# Patient Record
Sex: Male | Born: 1980 | Race: White | Hispanic: No | Marital: Married | Smoking: Never smoker
Health system: Southern US, Community
[De-identification: ages and names within clinical notes are randomized; demographics above are authoritative.]

## PROBLEM LIST (undated history)

## (undated) DIAGNOSIS — G4700 Insomnia, unspecified: Secondary | ICD-10-CM

## (undated) HISTORY — PX: ELBOW SURGERY: SHX618

## (undated) HISTORY — PX: VASECTOMY: SHX75

## (undated) HISTORY — PX: LASIK: SHX215

## (undated) HISTORY — DX: Insomnia, unspecified: G47.00

---

## 2010-02-28 ENCOUNTER — Emergency Department: Payer: Self-pay | Admitting: Emergency Medicine

## 2010-06-15 ENCOUNTER — Emergency Department: Payer: Self-pay | Admitting: Emergency Medicine

## 2013-04-24 ENCOUNTER — Emergency Department: Payer: Self-pay | Admitting: General Practice

## 2013-05-07 ENCOUNTER — Ambulatory Visit: Payer: Self-pay | Admitting: General Practice

## 2014-03-01 ENCOUNTER — Emergency Department: Payer: Self-pay | Admitting: Emergency Medicine

## 2014-03-01 LAB — CBC
HCT: 42.2 % (ref 40.0–52.0)
HGB: 14.3 g/dL (ref 13.0–18.0)
MCH: 28.8 pg (ref 26.0–34.0)
MCHC: 34 g/dL (ref 32.0–36.0)
MCV: 85 fL (ref 80–100)
PLATELETS: 245 10*3/uL (ref 150–440)
RBC: 4.98 10*6/uL (ref 4.40–5.90)
RDW: 12.8 % (ref 11.5–14.5)
WBC: 7 10*3/uL (ref 3.8–10.6)

## 2014-03-02 LAB — COMPREHENSIVE METABOLIC PANEL
ALBUMIN: 3.6 g/dL (ref 3.4–5.0)
ANION GAP: 12 (ref 7–16)
Alkaline Phosphatase: 74 U/L
BUN: 16 mg/dL (ref 7–18)
Bilirubin,Total: 0.3 mg/dL (ref 0.2–1.0)
CALCIUM: 8.3 mg/dL — AB (ref 8.5–10.1)
CHLORIDE: 108 mmol/L — AB (ref 98–107)
CO2: 23 mmol/L (ref 21–32)
Creatinine: 1.87 mg/dL — ABNORMAL HIGH (ref 0.60–1.30)
EGFR (Non-African Amer.): 44 — ABNORMAL LOW
GFR CALC AF AMER: 54 — AB
GLUCOSE: 89 mg/dL (ref 65–99)
Osmolality: 286 (ref 275–301)
POTASSIUM: 3.8 mmol/L (ref 3.5–5.1)
SGOT(AST): 20 U/L (ref 15–37)
SGPT (ALT): 17 U/L
Sodium: 143 mmol/L (ref 136–145)
TOTAL PROTEIN: 6.6 g/dL (ref 6.4–8.2)

## 2014-03-02 LAB — TROPONIN I
Troponin-I: <0.02 ng/mL
Troponin-I: <0.02 ng/mL

## 2014-03-02 LAB — CK: CK, TOTAL: 138 U/L

## 2014-08-20 ENCOUNTER — Ambulatory Visit
Admit: 2014-08-20 | Disposition: A | Discharge status: Home / Self Care | Payer: Self-pay | Attending: General Practice | Admitting: General Practice

## 2014-10-20 ENCOUNTER — Emergency Department
Admission: EM | Admit: 2014-10-20 | Discharge: 2014-10-20 | Disposition: A | Discharge status: Home / Self Care | Payer: Worker's Compensation | Attending: Emergency Medicine | Admitting: Emergency Medicine

## 2014-10-20 ENCOUNTER — Encounter: Payer: Self-pay | Admitting: Emergency Medicine

## 2014-10-20 DIAGNOSIS — Z7721 Contact with and (suspected) exposure to potentially hazardous body fluids: Secondary | ICD-10-CM

## 2014-10-20 NOTE — Discharge Instructions (Signed)
Body Fluid Exposure Information °People may come into contact with blood and other body fluids under various circumstances. In some cases, body fluids may contain germs (bacteria or viruses) that cause infections. These germs can be spread when another person's body fluids come into contact with your skin, mouth, eyes, or genitals.  °Exposure to body fluids that may contain infectious material is a common problem for people providing care for others who are ill. It can occur when a person is performing health care tasks in the workplace or when taking care of a family member at home. Other common methods of exposure include injection drug use, sharing needles, and sexual activity. °The risk of an infection spreading through body fluid exposure is small and depends on a variety of factors. This includes the type of body fluid, the nature of the exposure, and the health status of the person who was the source of the body fluids. Your health care provider can help you assess the risk. °WHAT TYPES OF BODY FLUID CAN SPREAD INFECTION? °The following types of body fluid have the potential to spread infections: °· Blood. °· Semen. °· Vaginal secretions. °· Urine. °· Feces. °· Saliva. °· Nasal or eye discharge. °· Breast milk. °· Amniotic fluid and fluids surrounding body organs. °WHAT ARE SOME FIRST-AID MEASURES FOR BODY FLUID EXPOSURE? °The following steps should be taken as soon as possible after a person is exposed to body fluids: °Intact Skin °· For contact with closed skin, wash the area with soap and water. °Broken Skin °· For contact with broken skin (a wound), wash the area with soap and water. Let the area bleed a little. Then place a bandage or clean towel on the wound, applying gentle pressure to stop the bleeding. Do not squeeze or rub the area. °· Use just water or hand sanitizer if a sink with soap is not available. °· Do not use harsh chemicals such as bleach or iodine. °Eyes °· Rinse the eyes with water or  saline for 30 seconds. °· If the person is wearing contact lenses, leave the contact lenses in while rinsing the eyes. Once the rinsing is complete, remove the contact lenses. °Mouth °· Spit out the fluids. Rinse and spit with water 4-5 times. °In addition, you should remove any clothing that comes into contact with body fluids. However, if body fluid exposure results from sexual assault, seek medical care immediately without changing clothes or bathing. °WHEN SHOULD YOU SEEK HELP? °After performing the proper first-aid steps, you should contact your health care provider or seek emergency care right away if blood or other body fluids made contact with areas of broken skin or openings such as the eyes or mouth. If the exposure to body fluid happened in the workplace, you should report it to your work supervisor immediately. Many workplaces have procedures in place for exposure situations. °WHAT WILL HAPPEN AFTER YOU REPORT THE EXPOSURE? °Your health care provider will ask you several questions. Information requested may include: °· Your medical history, including vaccination records. °· Date and time of the exposure. °· Whether you saw body fluids during the exposure. °· Type of body fluid you were exposed to. °· Volume of body fluid you were exposed to. °· How the exposure happened. °· If any devices, such as a needle, were being used. °· Which area of your body made contact with the body fluid. °· Description of any injury to the skin or other area. °· How long contact was made with the body   fluid. °· Any information you have about the health status of the person whose body fluid you were exposed to. °The health care provider will assess your risk of infection. Often, no treatment is necessary. In some cases, the health care provider may recommend doing blood tests right away. Follow-up blood tests may also be done at certain intervals during the upcoming weeks and months to check for changes. You may be offered  treatment to prevent an infection from developing after exposure (post-exposure prophylaxis). This may include certain vaccinations or medicines and may be necessary when there is a risk of a serious infection, such as HIV or hepatitis B. Your health care provider should discuss appropriate treatment and vaccinations with you. °HOW CAN YOU PREVENT EXPOSURE AND INFECTION? °Always remember that prevention is the first line of defense against body fluid exposure. To help prevent exposure to body fluids: °· Wash and disinfect countertops and other surfaces regularly. °· Wear appropriate protective gear such as gloves, gowns, or eyewear when the possibility of exposure is present. °· Wipe away spills of body fluid with disposable towels. °· Properly dispose of blood products and other fluids. Use secured bags. °· Properly dispose of needles and other instruments with sharp points or edges (sharps). Use closed, marked containers. °· Avoid injection drug use. °· Do not share needles. °· Avoid recapping needles. °· Use a condom during sexual intercourse. °· Make sure you learn and follow any guidelines for preventing exposure (universal precautions) provided at your workplace. °To help reduce your chances of getting an infection: °· Make sure your vaccinations are up-to-date, including those for tetanus and hepatitis. °· Wash your hands frequently with soap and water. Use hand sanitizers. °· Avoid having multiple sex partners. °· Follow up with your health care provider as directed after being evaluated for an exposure to body fluids. °To avoid spreading infection to others: °· Do not have sexual relations until you know you are free of infection. °· Do not donate blood, plasma, breast milk, sperm, or other body fluids. °· Do not share hygiene tools such as toothbrushes, razors, or dental floss. °· Keep open wounds covered. °· Dispose of any items with blood on them (razors, tampons, bandages) by putting them in the  trash. °· Do not share drug supplies with others, such as needles, syringes, straws, or pipes. °· Follow all of your health care provider's instructions for preventing the spread of infection. °Document Released: 12/19/2012 Document Revised: 04/23/2013 Document Reviewed: 12/19/2012 °Exit[employer]® Patient Information ©2015 [employer]. This information is not intended to replace advice given to you by your health care provider. Make sure you discuss any questions you have with your health care provider. ° °

## 2014-10-20 NOTE — ED Notes (Signed)
Broght to Ed via sqad car for eval. Of blood exposure in the field. Received A&O*3 speaking full sentences. Denies pain or SOB.
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
## 2015-05-01 ENCOUNTER — Ambulatory Visit
Admission: RE | Admit: 2015-05-01 | Discharge: 2015-05-01 | Disposition: A | Discharge status: Home / Self Care | Payer: BLUE CROSS/BLUE SHIELD | Source: Ambulatory Visit | Attending: Family Medicine | Admitting: Family Medicine

## 2015-05-01 ENCOUNTER — Other Ambulatory Visit: Payer: BLUE CROSS/BLUE SHIELD

## 2015-05-01 ENCOUNTER — Other Ambulatory Visit: Payer: Self-pay | Admitting: Family Medicine

## 2015-05-01 DIAGNOSIS — R0602 Shortness of breath: Secondary | ICD-10-CM

## 2016-11-24 DIAGNOSIS — S46219A Strain of muscle, fascia and tendon of other parts of biceps, unspecified arm, initial encounter: Secondary | ICD-10-CM | POA: Insufficient documentation

## 2018-02-16 ENCOUNTER — Other Ambulatory Visit: Payer: Self-pay

## 2018-02-16 ENCOUNTER — Emergency Department: Payer: No Typology Code available for payment source

## 2018-02-16 ENCOUNTER — Encounter: Payer: Self-pay | Admitting: Emergency Medicine

## 2018-02-16 ENCOUNTER — Emergency Department
Admission: EM | Admit: 2018-02-16 | Discharge: 2018-02-16 | Disposition: A | Discharge status: Home / Self Care | Payer: No Typology Code available for payment source | Attending: Emergency Medicine | Admitting: Emergency Medicine

## 2018-02-16 DIAGNOSIS — W51XXXA Accidental striking against or bumped into by another person, initial encounter: Secondary | ICD-10-CM | POA: Insufficient documentation

## 2018-02-16 DIAGNOSIS — Y9389 Activity, other specified: Secondary | ICD-10-CM | POA: Diagnosis not present

## 2018-02-16 DIAGNOSIS — Y929 Unspecified place or not applicable: Secondary | ICD-10-CM | POA: Insufficient documentation

## 2018-02-16 DIAGNOSIS — Y990 Civilian activity done for income or pay: Secondary | ICD-10-CM | POA: Diagnosis not present

## 2018-02-16 DIAGNOSIS — S0990XA Unspecified injury of head, initial encounter: Secondary | ICD-10-CM | POA: Diagnosis present

## 2018-02-16 DIAGNOSIS — S0001XA Abrasion of scalp, initial encounter: Secondary | ICD-10-CM | POA: Insufficient documentation
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
## 2018-02-16 NOTE — ED Notes (Signed)
Spoke with [employer] representative, no drug screen needed.
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
## 2018-02-16 NOTE — ED Notes (Signed)
Hard copy of DC signed, signature pad non-functional.

## 2018-02-18 ENCOUNTER — Emergency Department
Admission: EM | Admit: 2018-02-18 | Discharge: 2018-02-18 | Disposition: A | Discharge status: Home / Self Care | Payer: No Typology Code available for payment source | Attending: Emergency Medicine | Admitting: Emergency Medicine

## 2018-02-18 ENCOUNTER — Other Ambulatory Visit: Payer: Self-pay

## 2018-02-18 ENCOUNTER — Emergency Department: Payer: No Typology Code available for payment source

## 2018-02-18 DIAGNOSIS — F0781 Postconcussional syndrome: Secondary | ICD-10-CM | POA: Insufficient documentation

## 2018-02-18 DIAGNOSIS — R51 Headache: Secondary | ICD-10-CM | POA: Diagnosis not present

## 2018-02-18 NOTE — Discharge Instructions (Addendum)
Your exam is essentially normal at this time. There is no evidence of a serious, closed head injury. You are experiencing symptoms consistent with a post-concussive syndrome. These symptoms can last for days to weeks. Follow-up with the [facility] at the [facility], for further management. I suggest eye rest; by avoiding bright (fluorescent) lights, fine print (books/magazines), and cell phone use (texting). Take OTC Tylenol or Ibuprofen as needed for headache pain relief.
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
## 2018-02-18 NOTE — ED Notes (Signed)
Pt pt had CT head 2 days ago when seen. Per Dr. Shaune Pollack no need to re-do CT head and no blood work at this time. OK for Flex.

## 2018-10-23 ENCOUNTER — Other Ambulatory Visit: Payer: Self-pay | Admitting: Internal Medicine

## 2019-01-08 ENCOUNTER — Ambulatory Visit: Payer: Self-pay | Admitting: Internal Medicine

## 2019-01-08 ENCOUNTER — Encounter: Payer: Self-pay | Admitting: Internal Medicine

## 2019-01-08 ENCOUNTER — Other Ambulatory Visit: Payer: Self-pay

## 2019-01-08 DIAGNOSIS — M25522 Pain in left elbow: Secondary | ICD-10-CM | POA: Insufficient documentation

## 2019-01-08 DIAGNOSIS — S46292A Other injury of muscle, fascia and tendon of other parts of biceps, left arm, initial encounter: Secondary | ICD-10-CM | POA: Diagnosis not present

## 2019-01-08 DIAGNOSIS — S56812A Strain of other muscles, fascia and tendons at forearm level, left arm, initial encounter: Secondary | ICD-10-CM | POA: Diagnosis not present

## 2019-01-08 MED ORDER — NAPROXEN 500 MG PO TABS: 500.0000 mg | ORAL_TABLET | Freq: Two times a day (BID) | ORAL | 2 refills | Status: DC | PRN

## 2019-01-08 NOTE — Progress Notes (Signed)
S - Nursing note reviewed.   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  When lifting the state, he felt a pop, and had pain immediately in the medial left elbow region down towards his wrist.  The pain was so sharp he instantly vomited.  He noted the forearm beneath the elbow on the medial side bruised rather rapidly over the next hours.  He also noted it remained quite painful, and he has been limited in his activities.  He did ice rather liberally for the first 48 hours, and has been taking 800 mg of ibuprofen every 6 hours.  His pain is pretty much all the time, although intermittently it becomes very sharp and a 9 out of 10 type pain.  He has returned to work as a Engineer, structural on patrol, both yesterday and so far today.  He does admit if he had to wrestle a suspect or do any kind of lifting activities with that left upper extremity, he would be markedly limited.  He has never injured the medial side of his elbow in the past although he did have surgery on a tendon on the lateral side which he tore.  He states he was told it was shredded by the surgeon when it was fixed.  It was done by a Psychologist, sport and exercise in [place].  He states he does not really have to see that surgeon again for this injury, and would not like to drive up to [facility] with return appointments.  No Known Allergies Current Outpatient Medications on File Prior to Visit  Medication Sig Dispense Refill  . traZODone (DESYREL) 100 MG tablet TAKE 1 TABLET BY MOUTH AT BEDTIME AS NEEDED INSOMNIA 45 tablet 2   No current facility-administered medications on file prior to visit.    He has no smoking history.  O - NAD, masked  BP 115/68 (BP Location: Right Arm, Patient Position: Sitting, Cuff Size: Large)   Pulse 74   Temp 98.6 F (37 C) (Oral)   Resp 12   Ht 5\' 8"  (1.727 m)   Wt 195 lb (88.5 kg)   SpO2 98%   BMI 29.65 kg/m    UE - left Elbow -  Limited ROM at extremes mostly, very hesitant with extremes of flexion and extension  No marked swelling, mild likely medial and residual/fading bruising on proximal forearm medially  Not tender at lateral epicondyle  Tender at medial epicondyle diffusely surrounding   NT olecranon  Limited strength with biceps testing, was painful with more forceful exertion testng the biceps, no pain with triceps testing  Increased pain with wrist extension vs resistance, also with flexion vs resistance  Increased pain with pronation vs resistance, also with supination vs res  No mark increased pain with hand grip  Sensation intact to LT in distal UE and pulses good distal  Affect was not flat, approp with conversation  Ass/Plan: 1.  Left elbow pain, after a traumatic injury as described above.  Concern for a tear of a ligamentous type structure, potentially the ulnar collateral ligament.  Concern for some some bicep involvement as well.  Will refer to orthopedics, as do feel a further imaging study is needed, preferably an MRI or ultrasound type study as an x-ray will tell me little about the soft tissue structures.  May need an x-ray for completeness sake, although will await the evaluation from orthopedics presently. Naproxen sodium-500mg  twice daily with  food in the short-term as needed, as he states he does well with naproxen, not to take ibuprofen while taking. 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 He would prefer to see orthopedics here in the area, with the [facility] that was noted, and he will likely be seen by them here in the next 24 hours to 48 hours to be assessed.  Await their input.

## 2019-01-08 NOTE — Patient Instructions (Signed)
Take referral to [facility] they will see you as a walk-in until 7:30pm

## 2019-01-08 NOTE — Progress Notes (Signed)
Left elbow pain - taking a back seat out of a car & when lifting the seat, felt a pop in the elbow from wrist to the elbow.  Pain so sharp he instantly vomited. Then the wrist turned inward - pain so intense couldn't rest elbow on anything.  This happened Friday around noon.  Had surgery to the same elbow 3 years ago.  48 hours iced hourly.  800 mg ibuprofen q 6 hours.    Rates pain 9 on 0-10 when he moves arm certain way.  Sitting right now, it's uncomfortable.

## 2019-01-21 ENCOUNTER — Ambulatory Visit: Payer: Managed Care, Other (non HMO) | Admitting: Internal Medicine

## 2019-01-21 ENCOUNTER — Encounter: Payer: Self-pay | Admitting: Internal Medicine

## 2019-01-21 ENCOUNTER — Other Ambulatory Visit: Payer: Self-pay

## 2019-01-21 VITALS — BP 111/70 | HR 98 | Temp 97.7°F | Resp 12 | Ht 68.0 in | Wt 196.0 lb

## 2019-01-21 DIAGNOSIS — M25522 Pain in left elbow: Secondary | ICD-10-CM

## 2019-01-21 NOTE — Progress Notes (Signed)
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  I had referred him to orthopedics and he saw them later that same day at [facility]. I do not have access to get the note from that visit on Epic and do not have records from that. He noted ortho informed him that he likely had a tear, and an MRI was ordered but since he had not met his deductable, he had to provide over $600 up front to get done and he did not have that to give them.  To recall, when lifting the seat, he felt a pop, and had pain immediately in the medial left elbow region down towards his wrist.  The pain was so sharp he instantly vomited.  He noted the forearm beneath the elbow on the medial side bruised rather rapidly over the next hours.  He also noted it remained quite painful, and limited his activities. His pain was pretty much all the time, although intermittently it was very sharp and a 9 out of 10 type pain. He had returned to work as a Engineer, structural on patrol, prior to our visit with activities limited after our visit.   Over the almost two weeks since, he noted it feels much better, not painful at rest, was in the gym and did only very light lifting and tolerated. He noted when he tore the tendon on his lateral side of the elbow, he went years before having fixed and had cortisone shots at time to help and continued to work without a problem. He is anxious to return to work presently and is right handed and does not feel there will be any concerns with returning. He is taking the naproxen presently. Has not iced in last few days as not need to per patient.  He has never injured the medial side of his elbow in the past although he did have surgery on a tendon on the lateral side which he tore. It was  done by a Psychologist, sport and exercise in [place].  He is scheduled to return to work Friday. No Known Allergies Current Outpatient Medications on File Prior to Visit  Medication Sig Dispense Refill  . naproxen (NAPROSYN) 500 MG tablet Take 1 tablet (500 mg total) by mouth 2 (two) times daily as needed. Take with food 30 tablet 2  . traZODone (DESYREL) 100 MG tablet TAKE 1 TABLET BY MOUTH AT BEDTIME AS NEEDED INSOMNIA 45 tablet 2   No current facility-administered medications on file prior to visit.     He has no smoking history.  O - NAD, masked  BP 111/70 (BP Location: Right Arm, Patient Position: Sitting, Cuff Size: Large)   Pulse 98   Temp 97.7 F (36.5 C) (Oral)   Resp 12   Ht '5\' 8"'  (1.727 m)   Wt 196 lb (88.9 kg)   SpO2 98%   BMI 29.80 kg/m     UE - left Elbow - ROM of left elbow good without pain              No marked swelling, bruising medial side has resolved             Not tender at lateral epicondyle, + scar  Not tender palpating medial epicondyle, nor surrounding and NT down forearm distally             NT olecranon  No elbow laxity medially at 0 and 30 degrees             Very good strength with biceps testing, not painful             No increased pain with wrist extension vs resistance, nor with flexion vs resistance             No increased pain with pronation vs resistance, also with supination vs res             No mark increased pain with hand grip  All of the above testing vs resistance noted very good strength             Sensation intact to LT in distal UE and pulses good distal  Affect was not flat, approp with conversation  Ass/Plan: 1.  Left elbow pain, after a traumatic injury as described above.  Definitely better clinically, concern for a tear still (possibly the ulnar collateral ligament/flexor muscle/tendon and some concern for possibly some bicep involvement as well). Ideally, the next steps would include the MRI that the orthopedic MD  ordered to help assess if any surgery would be rec'ed. He was aware of this when discussed, but noted he just cannot afford at present. Ortho f/u felt needed only if has the MRI per patient   Did agree with ortho's rec and explained why that is best, and he understood, but the cost is prohibitive to him presently, and do feel his injury can often be well compensated for by adjacent structures and not be overly limiting, especially if partial tears and not complete and not involve the entire ligament or tendon (and he noted working for years with the tear on the lateral side of the elbow without being limited).    His ROM and strength testing today was very good and do feel he can do the functions of his job presently without limitations (especially noting he is right handed and this is his non-dominant hand) Would stop the Naproxen presently, can still ice prn and use ibuprofen with food prn if sore Note for work given with return to work ok after today and planned Friday at the earliest.  Emphasized to him if he has any limitations arise as he is returning to work, the importance to f/u immediately and he understood and agreed. Also to follow-up if more problematic over time and sx's again worsen.

## 2019-01-28 ENCOUNTER — Ambulatory Visit
Admission: RE | Admit: 2019-01-28 | Discharge: 2019-01-28 | Disposition: A | Discharge status: Home / Self Care | Payer: Self-pay | Source: Ambulatory Visit | Attending: Registered Nurse | Admitting: Registered Nurse

## 2019-01-28 ENCOUNTER — Encounter: Payer: Self-pay | Admitting: Registered Nurse

## 2019-01-28 ENCOUNTER — Ambulatory Visit: Payer: Managed Care, Other (non HMO) | Admitting: Registered Nurse

## 2019-01-28 ENCOUNTER — Other Ambulatory Visit: Payer: Self-pay

## 2019-01-28 VITALS — BP 131/75 | HR 100 | Temp 98.3°F | Resp 16 | Ht 68.0 in

## 2019-01-28 DIAGNOSIS — W19XXXA Unspecified fall, initial encounter: Secondary | ICD-10-CM

## 2019-01-28 DIAGNOSIS — M79602 Pain in left arm: Secondary | ICD-10-CM | POA: Insufficient documentation

## 2019-01-28 DIAGNOSIS — M79603 Pain in arm, unspecified: Secondary | ICD-10-CM | POA: Insufficient documentation

## 2019-01-28 MED ORDER — ACETAMINOPHEN 500 MG PO TABS: 1000.0000 mg | ORAL_TABLET | Freq: Four times a day (QID) | ORAL | 0 refills | Status: AC | PRN

## 2019-01-28 MED ORDER — KETOROLAC TROMETHAMINE 60 MG/2ML IM SOLN
60.0000 mg | Freq: Once | INTRAMUSCULAR | Status: AC
  Administered 2019-01-28: 60 mg via INTRAMUSCULAR

## 2019-01-28 NOTE — Progress Notes (Signed)
Subjective:    Patient ID: Gary Ortiz, male    DOB: 01/12/1981, 38 y.o.   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  Has pain with rotating wrist in his middle/proximal forearm, some swelling and redness noted.  Hasn't applied ice or taken any pain medication today.  Had injured his upper arm/biceps left and was seen by [facility] on 01/08/2019 for partial versus full thickness biceps tendon rupture.  MRI ordered but patient did not have completed due to copay required.  Patient reported his upper left arm has been feeling better and he has been back in the gym.  Previously injured area not hurting him at this time.  Toradol has worked well for him in the past.  He has not taken any tylenol/motrin/advil/aleve/naproxen/naprosyn/meloxicam/tramadol today.  Patient denied weakness in left arm just tender to touch or to perform twisting motion with hand/wrist results in forearm pain.  "I drove over here with just my right hand"  Showered and changed uniforms after injury due to subject (mental patient) defecated on me.  Right hand dominant     Review of Systems  Constitutional: Negative for activity change, appetite change, chills, diaphoresis, fatigue and fever.  HENT: Negative for trouble swallowing and voice change.   Eyes: Negative for photophobia and visual disturbance.  Respiratory: Negative for cough, choking, chest tightness, shortness of breath, wheezing and stridor.   Cardiovascular: Negative for chest pain, palpitations and leg swelling.  Gastrointestinal: Negative for abdominal pain, diarrhea, nausea and vomiting.  Endocrine: Negative for cold intolerance and heat intolerance.  Genitourinary: Negative for difficulty urinating.  Musculoskeletal: Positive for myalgias. Negative for arthralgias, back pain, gait problem, neck pain and  neck stiffness.  Skin: Positive for color change. Negative for pallor and wound.  Allergic/Immunologic: Negative for food allergies.  Neurological: Negative for dizziness, tremors, seizures, syncope, facial asymmetry, speech difficulty, weakness, light-headedness, numbness and headaches.  Hematological: Negative for adenopathy. Does not bruise/bleed easily.  Psychiatric/Behavioral: Negative for agitation, confusion and sleep disturbance.       Objective:   Physical Exam Vitals signs and nursing note reviewed.  Constitutional:      General: He is awake. He is not in acute distress.    Appearance: Normal appearance. He is well-developed, well-groomed and overweight. He is not ill-appearing, toxic-appearing or diaphoretic.  HENT:     Head: Normocephalic and atraumatic. No raccoon eyes, Battle's sign, abrasion, contusion, right periorbital erythema, left periorbital erythema or laceration.     Jaw: There is normal jaw occlusion.     Salivary Glands: Right salivary gland is not diffusely enlarged or tender. Left salivary gland is not diffusely enlarged or tender.     Right Ear: Hearing and external ear normal.     Left Ear: Hearing and external ear normal.     Nose: Nose normal.     Mouth/Throat:     Mouth: Mucous membranes are moist.     Pharynx: Oropharynx is clear.  Eyes:     General: Lids are normal. Vision grossly intact. Gaze aligned appropriately. No visual field deficit or scleral icterus.    Extraocular Movements: Extraocular movements intact.     Conjunctiva/sclera: Conjunctivae normal.     Pupils: Pupils are equal, round, and reactive to light.  Neck:     Musculoskeletal: Normal range of motion and neck supple. Normal range of  motion. No edema, erythema, neck rigidity, crepitus, injury, pain with movement, torticollis or muscular tenderness.     Trachea: Trachea normal.  Cardiovascular:     Rate and Rhythm: Normal rate and regular rhythm.     Pulses: Normal pulses.           Radial pulses are 2+ on the right side and 2+ on the left side.     Heart sounds: Normal heart sounds.  Pulmonary:     Effort: Pulmonary effort is normal. No respiratory distress.     Breath sounds: Normal breath sounds and air entry. No stridor or transmitted upper airway sounds. No wheezing, rhonchi or rales.     Comments: Wearing cloth mask due to covid 19 pandemic; no cough observed in exam room; spoke full sentences without difficulty Abdominal:     General: Abdomen is flat.     Palpations: Abdomen is soft.  Musculoskeletal: Normal range of motion.        General: Swelling, tenderness and signs of injury present. No deformity.     Right shoulder: Normal.     Left shoulder: Normal.     Right elbow: Normal.    Left elbow: Normal.     Right wrist: Normal.     Left wrist: Normal.     Right hip: Normal.     Left hip: Normal.     Right knee: Normal.     Left knee: Normal.     Right ankle: Normal.     Left ankle: Normal.     Cervical back: Normal.     Thoracic back: Normal.     Lumbar back: Normal.     Right upper arm: Normal.     Left upper arm: He exhibits no tenderness, no bony tenderness, no swelling, no edema, no deformity and no laceration.     Right forearm: Normal.     Left forearm: He exhibits tenderness, bony tenderness, swelling and edema. He exhibits no deformity and no laceration.       Arms:     Right hand: Normal. He exhibits normal range of motion, no tenderness, no bony tenderness, normal two-point discrimination, normal capillary refill, no deformity, no laceration and no swelling. Normal sensation noted. Normal strength noted. He exhibits no finger abduction, no thumb/finger opposition and no wrist extension trouble.     Left hand: Normal. He exhibits normal range of motion, no tenderness, no bony tenderness, normal two-point discrimination, normal capillary refill, no deformity, no laceration and no swelling. Normal sensation noted. Normal strength noted. He  exhibits no finger abduction, no thumb/finger opposition and no wrist extension trouble.     Right lower leg: No edema.     Left lower leg: No edema.     Comments: No crepitus/ecchymosis with AROM; patient grimacing with AROM especially supination and resisted arom left wrist/elbow; superficial and deep palpation even light touch elicits worsening pain over affected area anterior forearm.  Wrist flexion/extension/ulnar or radial deviation unresisted doesn't elicit or worsen pain against resistance it does in proximal forearm closest to elbow; distal radius/ulna and soft tissue not TTP; wrist superficial and deep palpation does not elicit pain.  Left upper arm palpation did not elicit pain/no ecchymosis/rash/erythema/increased temperature noted  Lymphadenopathy:     Head:     Right side of head: No submental, submandibular, tonsillar, preauricular, posterior auricular or occipital adenopathy.     Left side of head: No submental, submandibular, tonsillar, preauricular, posterior auricular or occipital adenopathy.     Cervical:  No cervical adenopathy.     Right cervical: No superficial cervical adenopathy.    Left cervical: No superficial cervical adenopathy.  Skin:    General: Skin is warm and dry.     Capillary Refill: Capillary refill takes less than 2 seconds.     Coloration: Skin is not ashen, cyanotic, jaundiced, mottled, pale or sallow.     Findings: Abrasion, erythema, signs of injury and rash present. No abscess, acne, bruising, burn, ecchymosis, laceration, lesion, petechiae or wound. Rash is macular and papular. Rash is not crusting, nodular, purpuric, pustular, scaling, urticarial or vesicular.     Nails: There is no clubbing.        Neurological:     General: No focal deficit present.     Mental Status: He is alert and oriented to person, place, and time. Mental status is at baseline.     GCS: GCS eye subscore is 4. GCS verbal subscore is 5. GCS motor subscore is 6.     Cranial  Nerves: Cranial nerves are intact. No cranial nerve deficit, dysarthria or facial asymmetry.     Sensory: Sensation is intact. No sensory deficit.     Motor: Motor function is intact. No weakness, tremor, atrophy, abnormal muscle tone or seizure activity.     Coordination: Coordination is intact. Coordination normal.     Gait: Gait is intact. Gait normal.     Deep Tendon Reflexes:     Reflex Scores:      Brachioradialis reflexes are 2+ on the right side and 2+ on the left side.    Comments: Finger opposition against resistance bilaterally equal 5/5; upper/lower extremities strength 5/5 equal but pain with left upper against resistance; gait sure and steady hallway; on/off exam table and in/out of chair without difficulty; patient will hold phone/papers/laptop with either hand but prefers right due to pain  Psychiatric:        Attention and Perception: Attention and perception normal.        Mood and Affect: Mood and affect normal.        Speech: Speech normal.        Behavior: Behavior normal. Behavior is cooperative.        Thought Content: Thought content normal.        Cognition and Memory: Cognition and memory normal.        Judgment: Judgment normal.     Ice applied to left forearm after physical exam completed with chemical ice pack from clinic stock prior to ketoralac 60mg  IM by .  1622 received verbal report from imaging center negative for fracture in forearm. See results note patient notified of negative fracture via telephone and verbalized understanding of information/continue plan of care as previously discussed for muscle strain/contusion.    Assessment & Plan:  A-left forearm pain acute, fall initial visit  P-work restriction avoid lifting greater than 5lbs left hand, no prolonged driving, no impact left hand follow up in 1 week re-eval.  Xray forearm today after leaving clinic at outpatient [facility] facility.  Apply ice QID 15 minutes prn swelling pain.   Toradol injection today 60mg  IM x 1 by RN [facility] and waiting 15 minutes in exam room after to ensure no hypotension/dizzyness.  Patient has received previously without side effects.  Discussed avoid other NSAIDS advil/aleve/naproxen/naprosyn/mobic/ibuprofen/meloxicam/tramadol x 24 hours post injection  Patient has naproxen at home max dosing with food 500mg  po BID.  If he prefers toradol may send in Rx for him for additional 4  days po use starting tomorrow afternoon. Depends on patient preference.   May use tylenol 1000mg  po QID prn pain.   Patient stated pain started to decrease prior to discharge from clinic, no bleeding from injection site denied dizzyness and he departed ambulatory in NAD, resp even and unlabored forearm not swelling more after ice and rest in exam room.  Patient to monitor and if decreased circulation to fingers e.g blue/cold/numb follow up for re-evaluation ER after clinic hours/ overnight.  [facility] during day.  If worsening swelling elevate limb and apply ice for 15 minutes.  If no improvement seek follow up eval.  Patient verbalized understanding information/instructions, agreed with plan of care and had no further questions at this time.

## 2019-01-28 NOTE — Progress Notes (Signed)
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  Discomfort is left inner forearm (below the elbow).  Can't turn it inside.  No OTC medications taken. No ice since incident occurred.  AMD

## 2019-01-28 NOTE — Patient Instructions (Addendum)
Wrist and Forearm Exercises Ask your health care provider which exercises are safe for you. Do exercises exactly as told by your health care provider and adjust them as directed. It is normal to feel mild stretching, pulling, tightness, or discomfort as you do these exercises. Stop right away if you feel sudden pain or your pain gets worse. Do not begin these exercises until told by your health care provider. Range-of-motion exercises These exercises warm up your muscles and joints and improve the movement and flexibility of your injured wrist and forearm. These exercises also help to relieve pain, numbness, and tingling. These exercises are done using the muscles in your injured wrist and forearm. Wrist flexion 1. Bend your left / right elbow to a 90-degree angle (right angle) with your palm facing the floor. 2. Bend your wrist so that your fingers point toward the floor (flexion). 3. Hold this position for ____15______ seconds. 4. Slowly return to the starting position. Repeat _____3_____ times. Complete this exercise ______3____ times a day. Wrist extension 1. Bend your left / right elbow to a 90-degree angle (right angle) with your palm facing the floor. 2. Bend your wrist so that your fingers point toward the ceiling (extension). 3. Hold this position for _____15_____ seconds. 4. Slowly return to the starting position. Repeat _____3_____ times. Complete this exercise ____3______ times a day. Ulnar deviation 1. Bend your left / right elbow to a 90-degree angle (right angle), and rest your forearm on a table with your palm facing down. 2. Keeping your hand flat on the table, bend your left /right wrist toward your small finger (pinkie). This is ulnar deviation. 3. Hold this position for ____15______ seconds. 4. Slowly return to the starting position. Repeat ____3______ times. Complete this exercise ____3______ times a day. Radial deviation 1. Bend your left / right elbow to a 90-degree  angle (right angle), and rest your forearm on a table with your palm facing down. 2. Keeping your hand flat on the table, bend your left /right wrist toward your thumb. This is radial deviation. 3. Hold this position for _____15_____ seconds. 4. Slowly return to the starting position. Repeat ___3_______ times. Complete this exercise ____3______ times a day. Forearm rotation, supination  1. Sit with your left / right elbow bent to a 90-degree angle (right angle). Position your forearm so that the thumb is facing the ceiling (neutral position). 2. Turn (rotate) your palm up toward the ceiling (supination), stopping when you feel a gentle stretch. 3. Hold this position for ___15_______ seconds. 4. Slowly return to the starting position. Repeat ____3______ times. Complete this exercise ____3______ times a day. Forearm rotation, pronation  1. Sit with your left / right elbow bent to a 90-degree angle (right angle). Position your forearm so that the thumb is facing the ceiling (neutral position). 2. Rotate your palm down toward the floor (pronation), stopping when you feel a gentle stretch. 3. Hold this position for _____15_____ seconds. 4. Slowly return to the starting position. Repeat ___3_______ times. Complete this exercise ____3______ times a day. Stretching These exercises warm up your muscles and joints and improve the movement and flexibility of your injured wrist and forearm. These exercises also help to relieve pain, numbness, and tingling. These exercises are done using your healthy wrist and forearm to help stretch the muscles in your injured wrist and forearm. Wrist flexion  1. Extend your left / right arm in front of you, and turn your palm down toward the floor. ? If told by  your health care provider, bend your left / right elbow to a 90-degree angle (right angle) at your side. 2. Using your uninjured hand, gently press over the back of your left / right hand to bend your wrist and  fingers toward the floor (flexion). Go as far as you can to feel a stretch without causing pain. 3. Hold this position for _____15_____ seconds. 4. Slowly return to the starting position. Repeat 3 times. Complete this exercise ___3_______ times a day. Wrist extension  1. Extend your left / right arm in front of you and turn your palm up toward the ceiling. ? If told by your health care provider, bend your left / right elbow to a 90-degree angle (right angle) at your side. 2. Using your uninjured hand, gently press over the palm of your left / right hand to bend your wrist and fingers toward the floor (extension). Go as far as you can to feel a stretch without causing pain. 3. Hold this position for ____15______ seconds. 4. Slowly return to the starting position. Repeat ____3______ times. Complete this exercise _____3_____ times a day. Forearm rotation, supination 1. Sit with your left / right elbow bent to a 90-degree angle (right angle). Position your forearm so that the thumb is facing the ceiling (neutral position). 2. Rotate your palm up toward the ceiling as far as you can on your own (supination). Then, use your uninjured hand to help turn your forearm more, stopping when you feel a gentle stretch. 3. Hold this position for ____15______ seconds. 4. Slowly return to the starting position. Repeat _____3_____ times. Complete this exercise ___3_______ times a day. Forearm rotation, pronation 1. Sit with your left / right elbow bent to a 90-degree angle (right angle). Position your forearm so that the thumb is facing the ceiling (neutral position). 2. Rotate your palm down toward the floor as far as you can on your own (pronation). Then, use your uninjured hand to help turn your forearm more, stopping when you feel a gentle stretch. 3. Hold this position for ___15_______ seconds. 4. Slowly return to the starting position. Repeat ___3_______ times. Complete this exercise ____3______ times a  day. Strengthening exercises These exercises build strength and endurance in your wrist and forearm. Endurance is the ability to use your muscles for a long time, even after they get tired. Wrist flexion  1. Sit with your left / right forearm supported on a table or other surface. Bend your elbow to a 90-degree angle (right angle), and rest your hand palm-up over the edge of the table. 2. Hold a ___none_______ weight in your left / right hand. Or, hold an exercise band or tube in both hands, keeping your hands at the same level and hip distance apart. There should be a slight tension in the exercise band or tube. 3. Slowly curl your hand up toward the ceiling (flexion). 4. Hold this position for __15________ seconds. 5. Slowly lower your hand back to the starting position. Repeat ____3______ times. Complete this exercise ____3______ times a day. Wrist extension  1. Sit with your left / right forearm supported on a table or other surface. Bend your elbow to a 90-degree angle (right angle), and rest your hand palm-down over the edge of the table. 2. Hold a __none________ weight in your left / right hand. Or, hold an exercise band or tube in both hands, keeping your hands at the same level and hip distance apart. There should be a slight tension in the exercise  band or tube. 3. Slowly curl your hand up toward the ceiling (extension). 4. Hold this position for ___15_______ seconds. 5. Slowly lower your hand back to the starting position. Repeat 3 times. Complete this exercise _____3_____ times a day. Forearm rotation, supination  1. Sit with your left / right forearm supported on a table or other surface. Bend your elbow to a 90-degree angle (right angle). Position your forearm so that your thumb is facing the ceiling (neutral position) and your hand is resting over the edge of the table. 2. Hold a hammer in your left / right hand. ? This exercise will be easier if you hold the hammer near the  head of the hammer. ? This exercise will be harder if you hold the hammer near the end of the handle. 3. Without moving your elbow, slowly rotate your palm up toward the ceiling (supination). 4. Hold this position for _____15_____ seconds. 5. Slowly return to the starting position. Repeat _____3_____ times. Complete this exercise ____3______ times a day. Forearm rotation, pronation  1. Sit with your left / right forearm supported on a table or other surface. Bend your elbow to a 90-degree angle (right angle). Position your forearm so that the thumb is facing the ceiling (neutral position), with your hand resting over the edge of the table. 2. Hold a hammer in your left / right hand. ? This exercise will be easier if you hold the hammer near the head of the hammer. ? This exercise will be harder if you hold the hammer near the end of the handle. 3. Without moving your elbow, slowly rotate your palm down toward the floor (pronation). 4. Hold this position for _____15_____ seconds. 5. Slowly return to the starting position. Repeat _____3_____ times. Complete this exercise ___3_______ times a day. Grip strengthening  1. Grasp a stress ball or other ball in the middle of your left / right hand. Start with your elbow bent to a 90-degree angle (right angle). 2. Slowly increase the pressure, squeezing the ball as hard as you can without causing pain. ? Think of bringing the tips of your fingers into the middle of your palm. All of your finger joints should bend when doing this exercise. ? To make this exercise harder, gradually try to straighten your elbow in front of you, until you can do the exercise with your elbow fully straight. 3. Hold your squeeze for __15________ seconds, then relax. If instructed by your health care provider, do this exercise: ? With your forearm positioned so that the thumb is facing the ceiling (neutral position). ? With your forearm turned palm down. ? With your forearm  turned palm up. Repeat _____3_____ times. Complete this exercise ______3____ times a day. This information is not intended to replace advice given to you by your health care provider. Make sure you discuss any questions you have with your health care provider. Document Released: 03/02/2005 Document Revised: 06/07/2018 Document Reviewed: 06/07/2018 Elsevier Patient Education  2020 [employer]  RICE Therapy for Routine Care of Injuries The routine care of many injuries includes rest, ice, compression, and elevation (RICE therapy). RICE therapy is often recommended for injuries to soft tissues, such as muscle strain, sprains, bruises, and overuse injuries. It can also be used for some bone injuries. Using RICE therapy can help to relieve pain and lessen swelling. Supplies needed:  Ice.  Plastic bag.  Towel.  Elastic bandage.  Pillow or pillows to raise (elevate) the injured body part. How to care for  your injury with RICE therapy  Rest Rest your injury. This may help with the healing process. Rest usually involves limiting your normal activities and not using the injured part of your body. Generally, you can return to your normal activities when your health care provider says it is okay and you can do them without much discomfort. If you rest the injury too much, it may not heal as well. Some injuries heal better with early movement instead of resting for too long. Talk with your health care provider about how you should limit your activities and whether you should start range-of-motion exercises for your injury. Ice Ice your injury to lessen swelling and pain. Do not apply ice directly to your skin.  Put ice in a plastic bag.  Place a towel between your skin and the bag.  Leave the ice on for 20 minutes, 2-3 times a day. Use ice on as many days as told by your health care provider. Compression Put pressure (compression) on your injured area to control swelling, give support, and  help with discomfort. Compression may be done with an elastic bandage. If an elastic bandage has been applied, follow these general tips:  Use the bandage as directed by the maker of the bandage that you are using.  Do notwrap the bandage too tightly. That may block (cut off) circulation in the arm or leg in the area below the bandage. ? If part of your body beyond the bandage becomes blue, numb, cold, swollen, or more painful, your bandage is probably too tight. If this occurs, remove your bandage and reapply it more loosely.  Remove and reapply the bandage every 3-4 hours or as told by your health care provider.  See your health care provider if the bandage seems to be making your problems worse rather than better. Elevation Elevate your injured area to lessen swelling and pain. If possible, elevate your injured area at or above the level of your heart or the center of your chest. Contact a health care provider if:  Your pain and swelling continue.  Your symptoms are getting worse rather than improving. Having these problems may mean that you need further evaluation or imaging tests, such as X-rays or an MRI. Sometimes, X-rays may not show a small broken bone (fracture) until days after the injury happened. Make a follow-up appointment with your health care provider. Ask your health care provider, or the department that is doing the imaging test, when your results will be ready. Get help right away if:  You have sudden severe pain at or below the area of your injury.  You have redness or increased swelling around your injury.  You have tingling or numbness at or below the area of your injury and it does not improve after you remove the elastic bandage. Summary  The routine care of many injuries includes rest, ice, compression, and elevation (RICE therapy). Using RICE therapy can help to relieve pain and lessen swelling.  RICE therapy is often recommended for injuries to soft tissues,  such as muscle strain, sprains, bruises, and overuse injuries. It can also be used for some bone injuries.  Seek medical care if your pain and swelling continue or if your symptoms are getting worse rather than improving. This information is not intended to replace advice given to you by your health care provider. Make sure you discuss any questions you have with your health care provider. Document Released: 07/31/2000 Document Revised: 01/06/2017 Document Reviewed: 01/06/2017 Elsevier Patient Education  2020 Elsevier Inc.  

## 2019-02-05 ENCOUNTER — Encounter: Payer: Self-pay | Admitting: Occupational Medicine

## 2019-02-05 ENCOUNTER — Ambulatory Visit: Payer: Managed Care, Other (non HMO) | Admitting: Occupational Medicine

## 2019-02-05 ENCOUNTER — Other Ambulatory Visit: Payer: Self-pay

## 2019-02-05 VITALS — BP 119/68 | HR 77 | Temp 97.4°F | Resp 12 | Ht 68.0 in | Wt 192.0 lb

## 2019-02-05 DIAGNOSIS — M79602 Pain in left arm: Secondary | ICD-10-CM

## 2019-02-05 NOTE — Progress Notes (Signed)
Hx of lt torn bicep - non-work related. Has seen [facility].  They wanted to do an MRI, but out of pocket expense kept him from doing it.  Workers Comp - left forearm injury States has improved.  Aggravatedyesterday  by chopping some wood. States followed restrictions until yesterday.  AMD

## 2019-05-17 ENCOUNTER — Other Ambulatory Visit: Payer: Self-pay

## 2019-05-17 MED ORDER — TRAZODONE HCL 100 MG PO TABS: ORAL_TABLET | ORAL | 0 refills | Status: DC

## 2019-05-17 NOTE — Telephone Encounter (Signed)
Patient would like refill for trazodone sent to [facility].

## 2019-05-17 NOTE — Telephone Encounter (Signed)
Patient at pharmacy requesting Trazodone 100 mg that he uses prn for sleep Reports totally out  Last annual exam with NP-C Betancourt  was 05/10/2018 Trazodone 100 mg  #45 refill given at that time-    Will OK # 30    1 prn sleep - needs to schedule annual exam for update and additional Rx  Please schedule annual exam appointment and notify him  [facility]

## 2019-05-29 ENCOUNTER — Ambulatory Visit: Payer: Self-pay | Admitting: Physician Assistant

## 2019-05-29 ENCOUNTER — Ambulatory Visit: Payer: Self-pay

## 2019-05-29 ENCOUNTER — Other Ambulatory Visit: Payer: Self-pay

## 2019-05-29 VITALS — BP 112/80 | HR 87 | Temp 97.8°F | Ht 67.0 in | Wt 185.0 lb

## 2019-05-29 DIAGNOSIS — Z0000 Encounter for general adult medical examination without abnormal findings: Secondary | ICD-10-CM

## 2019-05-29 DIAGNOSIS — S92411A Displaced fracture of proximal phalanx of right great toe, initial encounter for closed fracture: Secondary | ICD-10-CM

## 2019-05-29 LAB — POCT URINALYSIS DIPSTICK
Bilirubin, UA: NEGATIVE
Blood, UA: NEGATIVE
Glucose, UA: NEGATIVE
Ketones, UA: NEGATIVE
Leukocytes, UA: NEGATIVE
Nitrite, UA: NEGATIVE
Protein, UA: NEGATIVE
Spec Grav, UA: 1.01 (ref 1.010–1.025)
Urobilinogen, UA: 0.2 E.U./dL
pH, UA: 6 (ref 5.0–8.0)

## 2019-05-29 NOTE — Progress Notes (Signed)
   Subjective: Fractured toe    Patient ID: Gary Ortiz, male    DOB: 05/05/1980, 39 y.o.   MRN: [id number]  HPI Patient presents with pain edema to the right great toe secondary to contusion 2 days ago.  Patient pain increased with ambulation and prolonged standing.   Review of Systems    Negative except for complaint. Objective:   Physical Exam No obvious deformity to the right great toe.  Patient has moderate edema and ecchymosis to the dorsal lateral aspect of the great toe.  Moderate guarding palpation of the distal phalanges.  Decreased range of motion with flexion and extension limited by complaint of pain.       Assessment & Plan: Fraction right great toe  Patient given discharge care instructions on buddy taping the toe for the next 2 weeks.  Given work restriction for 2 weeks and advised return to clinic in 2 weeks for reevaluation.  Follow-up sooner if condition worsens.

## 2019-05-29 NOTE — Progress Notes (Signed)
Patient is here today for pre physical labs and EKG. Patient is scheduled for a physical on 06/06/19 with Anette Riedel, PA-C.

## 2019-05-30 LAB — CMP12+LP+TP+TSH+6AC+PSA+CBC?
ALT: 11 IU/L (ref 0–44)
Alkaline Phosphatase: 86 IU/L (ref 39–117)
BUN/Creatinine Ratio: 12 (ref 9–20)
BUN: 13 mg/dL (ref 6–20)
Basos: 1 %
Cholesterol, Total: 192 mg/dL (ref 100–199)
Eos: 3 %
Free Thyroxine Index: 1.3 (ref 1.2–4.9)
GFR calc Af Amer: 95 mL/min/{1.73_m2} (ref >59)
GFR calc non Af Amer: 82 mL/min/{1.73_m2} (ref >59)
Globulin, Total: 2.5 g/dL (ref 1.5–4.5)
Glucose: 92 mg/dL (ref 65–99)
Hematocrit: 46.8 % (ref 37.5–51.0)
Immature Grans (Abs): 0 10*3/uL (ref 0.0–0.1)
LDL Chol Calc (NIH): 110 mg/dL — ABNORMAL HIGH (ref 0–99)
Monocytes Absolute: 0.5 10*3/uL (ref 0.1–0.9)
Phosphorus: 3.3 mg/dL (ref 2.8–4.1)
RBC: 5.59 x10E6/uL (ref 4.14–5.80)
TSH: 2.41 u[IU]/mL (ref 0.450–4.500)
VLDL Cholesterol Cal: 23 mg/dL (ref 5–40)

## 2019-05-30 LAB — CMP12+LP+TP+TSH+6AC+PSA+CBC…
AST: 21 IU/L (ref 0–40)
Albumin/Globulin Ratio: 1.8 (ref 1.2–2.2)
Albumin: 4.5 g/dL (ref 4.0–5.0)
Basophils Absolute: 0 10*3/uL (ref 0.0–0.2)
Bilirubin Total: 0.5 mg/dL (ref 0.0–1.2)
Calcium: 9.3 mg/dL (ref 8.7–10.2)
Chloride: 99 mmol/L (ref 96–106)
Chol/HDL Ratio: 3.3 ratio (ref 0.0–5.0)
Creatinine, Ser: 1.12 mg/dL (ref 0.76–1.27)
EOS (ABSOLUTE): 0.2 10*3/uL (ref 0.0–0.4)
Estimated CHD Risk: <0.5 times avg. (ref 0.0–1.0)
GGT: 11 IU/L (ref 0–65)
HDL: 59 mg/dL (ref >39)
Hemoglobin: 16.4 g/dL (ref 13.0–17.7)
Immature Granulocytes: 0 %
Iron: 126 ug/dL (ref 38–169)
LDH: 184 IU/L (ref 121–224)
Lymphocytes Absolute: 2 10*3/uL (ref 0.7–3.1)
Lymphs: 30 %
MCH: 29.3 pg (ref 26.6–33.0)
MCHC: 35 g/dL (ref 31.5–35.7)
MCV: 84 fL (ref 79–97)
Monocytes: 7 %
Neutrophils Absolute: 4.1 10*3/uL (ref 1.4–7.0)
Neutrophils: 59 %
Platelets: 230 10*3/uL (ref 150–450)
Potassium: 4.7 mmol/L (ref 3.5–5.2)
Prostate Specific Ag, Serum: 0.5 ng/mL (ref 0.0–4.0)
RDW: 12.7 % (ref 11.6–15.4)
Sodium: 138 mmol/L (ref 134–144)
T3 Uptake Ratio: 27 % (ref 24–39)
T4, Total: 4.9 ug/dL (ref 4.5–12.0)
Total Protein: 7 g/dL (ref 6.0–8.5)
Triglycerides: 129 mg/dL (ref 0–149)
Uric Acid: 5.5 mg/dL (ref 3.8–8.4)
WBC: 6.8 10*3/uL (ref 3.4–10.8)

## 2019-06-06 ENCOUNTER — Other Ambulatory Visit: Payer: Self-pay

## 2019-06-06 ENCOUNTER — Ambulatory Visit: Payer: Self-pay | Admitting: Physician Assistant

## 2019-06-06 VITALS — BP 100/70 | HR 85 | Temp 98.7°F | Ht 68.0 in | Wt 190.0 lb

## 2019-06-06 DIAGNOSIS — Z0000 Encounter for general adult medical examination without abnormal findings: Secondary | ICD-10-CM

## 2019-06-06 DIAGNOSIS — S92411A Displaced fracture of proximal phalanx of right great toe, initial encounter for closed fracture: Secondary | ICD-10-CM

## 2019-06-06 MED ORDER — TRAZODONE HCL 100 MG PO TABS: ORAL_TABLET | ORAL | 1 refills | Status: DC

## 2019-06-06 NOTE — Progress Notes (Signed)
Subjective:    Patient ID: Gary Ortiz, male    DOB: [birth year], 39 y.o.   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 Doing well. Recently fractured right great toe- has 2 week follow up evaluation next week. Gary Ortiz reports toe as still blue and uncomfortable when he is active but improved. Wears work boots for support  Has ceased all medications in past record except Trazodone 100 mg qhs for sleep. Uses it turn turn off events of the day and allow sleep. On days off usually take 1/2 only -to be sure I can go to sleep" Has tried OTCs like melatonin without success  Reports 1 shot whiskey each evening and 2 of weekends as end of day-- encouraged to stay aware and use shot glass measurement. Also be aware  calories and liver care  Non smoker  Last year brought divorce- doing well now. Feels for the better. 2 older children involved late teens. Apparently doing well with it .  Left arm injury hx -doing well except aches with exertion -splitting wood  Review of Systems Labs reviewed - LDL elevated slightly at 110- discussed Generally chicken and fish with predominantly Vegan diet    Objective:   Physical Exam Vitals and nursing note reviewed.  Constitutional:      Appearance: Normal appearance.     Comments: overweight  HENT:     Head: Normocephalic and atraumatic.     Right Ear: Tympanic membrane and ear canal normal. There is no impacted cerumen.     Left Ear: Tympanic membrane and ear canal normal. There is no impacted cerumen.     Ears:     Comments: Uses Qtips daily after shower    Nose: Nose normal.     Mouth/Throat:     Mouth: Mucous membranes are moist.     Comments: Once yearly DDS- encouraged q 6 mos company benefit Eyes:     Extraocular Movements: Extraocular movements intact.     Pupils: Pupils are equal, round, and reactive to light.     Comments: Lasik-  A year and a half ago- pleased. Failed to go to one year follow up and encouraged to do so   Cardiovascular:     Rate and Rhythm: Normal rate and regular rhythm.     Pulses: Normal pulses.     Heart sounds: Normal heart sounds.  Pulmonary:     Effort: Pulmonary effort is normal.     Breath sounds: Normal breath sounds.  Abdominal:     General: Abdomen is flat.     Palpations: Abdomen is soft.  Genitourinary:    Comments: Deferred. Denies concerns Musculoskeletal:        General: Normal range of motion.     Cervical back: Normal range of motion and neck supple.  Skin:    General: Skin is warm and dry.     Capillary Refill: Capillary refill takes less than 2 seconds.  Neurological:     General: No focal deficit present.     Mental Status: He is alert.  Psychiatric:        Mood and Affect: Mood normal.        Behavior: Behavior normal.        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

## 2019-06-10 ENCOUNTER — Ambulatory Visit: Payer: Self-pay | Admitting: Physician Assistant

## 2019-06-10 ENCOUNTER — Other Ambulatory Visit: Payer: Self-pay

## 2019-06-10 ENCOUNTER — Encounter: Payer: Self-pay | Admitting: Physician Assistant

## 2019-06-10 VITALS — BP 110/70 | HR 89 | Wt 187.4 lb

## 2019-06-10 DIAGNOSIS — S92401D Displaced unspecified fracture of right great toe, subsequent encounter for fracture with routine healing: Secondary | ICD-10-CM

## 2019-06-10 NOTE — Progress Notes (Signed)
   Subjective:Fracture Toe    Patient ID: Gary Ortiz, male    DOB: 23-Sep-1980, 39 y.o.   MRN: [id number]  HPI Patient present for follow up fracture right great toe. States mild discomfort.   Review of Systems    Negative except for compliant. Objective:   Physical Exam No obvious deformity to right toe.No edema. No guarding with palpation. Full and equal range of motions.       Assessment & Plan:Healing toe fracture  Return to full duty. Recommend continual taping for two additional weeks while working. Follow up as needed.

## 2019-08-21 ENCOUNTER — Other Ambulatory Visit: Payer: Self-pay

## 2019-08-21 DIAGNOSIS — G4700 Insomnia, unspecified: Secondary | ICD-10-CM

## 2019-08-21 MED ORDER — TRAZODONE HCL 100 MG PO TABS: ORAL_TABLET | ORAL | 0 refills | Status: DC

## 2019-08-21 NOTE — Telephone Encounter (Signed)
Patient has been receiving trazodone rx from [facility] providers since 2019.  Reviewed [place] PMP website today.  Last physical with me 05/2018 and [facility] provider PA Lee on 06/06/2019  Last office visit 06/10/2019  Electronic Rx to his pharmacy of choice trazodone 100mg  po qhs prn insomnia #60 RF0

## 2019-10-09 ENCOUNTER — Other Ambulatory Visit: Payer: Self-pay | Admitting: Registered Nurse

## 2019-10-09 DIAGNOSIS — G4700 Insomnia, unspecified: Secondary | ICD-10-CM

## 2019-10-10 NOTE — Telephone Encounter (Signed)
[facility] pt. Thanks!
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
## 2020-03-18 ENCOUNTER — Other Ambulatory Visit: Payer: Self-pay

## 2020-03-18 DIAGNOSIS — Z1152 Encounter for screening for COVID-19: Secondary | ICD-10-CM

## 2020-03-19 LAB — SARS-COV-2, NAA 2 DAY TAT

## 2020-03-19 LAB — NOVEL CORONAVIRUS, NAA: SARS-CoV-2, NAA: NOT DETECTED

## 2020-06-15 NOTE — Progress Notes (Signed)
Pt scheduled to complete physical 06/18/20 Needs ekg and UA, CL,RMA

## 2020-06-16 ENCOUNTER — Ambulatory Visit: Payer: Self-pay

## 2020-06-16 ENCOUNTER — Other Ambulatory Visit: Payer: Self-pay

## 2020-06-16 DIAGNOSIS — Z0000 Encounter for general adult medical examination without abnormal findings: Secondary | ICD-10-CM

## 2020-06-17 LAB — CMP12+LP+TP+TSH+6AC+PSA+CBC…
ALT: 13 IU/L (ref 0–44)
AST: 25 IU/L (ref 0–40)
Albumin/Globulin Ratio: 1.7 (ref 1.2–2.2)
Albumin: 4.8 g/dL (ref 4.0–5.0)
Alkaline Phosphatase: 77 IU/L (ref 44–121)
BUN/Creatinine Ratio: 9 (ref 9–20)
BUN: 12 mg/dL (ref 6–24)
Basophils Absolute: 0 10*3/uL (ref 0.0–0.2)
Basos: 1 %
Bilirubin Total: 0.6 mg/dL (ref 0.0–1.2)
Calcium: 9.6 mg/dL (ref 8.7–10.2)
Chloride: 101 mmol/L (ref 96–106)
Chol/HDL Ratio: 2.6 ratio (ref 0.0–5.0)
Cholesterol, Total: 208 mg/dL — ABNORMAL HIGH (ref 100–199)
Creatinine, Ser: 1.27 mg/dL (ref 0.76–1.27)
EOS (ABSOLUTE): 0.1 10*3/uL (ref 0.0–0.4)
Eos: 2 %
Estimated CHD Risk: <0.5 times avg. (ref 0.0–1.0)
Free Thyroxine Index: 1.6 (ref 1.2–4.9)
GFR calc Af Amer: 81 mL/min/{1.73_m2} (ref >59)
GFR calc non Af Amer: 70 mL/min/{1.73_m2} (ref >59)
GGT: 12 IU/L (ref 0–65)
Globulin, Total: 2.8 g/dL (ref 1.5–4.5)
Glucose: 104 mg/dL — ABNORMAL HIGH (ref 65–99)
HDL: 81 mg/dL (ref >39)
Hematocrit: 49 % (ref 37.5–51.0)
Hemoglobin: 16.2 g/dL (ref 13.0–17.7)
Immature Grans (Abs): 0.1 10*3/uL (ref 0.0–0.1)
Immature Granulocytes: 1 %
Iron: 106 ug/dL (ref 38–169)
LDH: 165 IU/L (ref 121–224)
LDL Chol Calc (NIH): 116 mg/dL — ABNORMAL HIGH (ref 0–99)
Lymphocytes Absolute: 2 10*3/uL (ref 0.7–3.1)
Lymphs: 40 %
MCH: 29 pg (ref 26.6–33.0)
MCHC: 33.1 g/dL (ref 31.5–35.7)
MCV: 88 fL (ref 79–97)
Monocytes Absolute: 0.4 10*3/uL (ref 0.1–0.9)
Monocytes: 7 %
Neutrophils Absolute: 2.5 10*3/uL (ref 1.4–7.0)
Neutrophils: 49 %
Phosphorus: 3.2 mg/dL (ref 2.8–4.1)
Platelets: 243 10*3/uL (ref 150–450)
Potassium: 4.9 mmol/L (ref 3.5–5.2)
Prostate Specific Ag, Serum: 0.6 ng/mL (ref 0.0–4.0)
RBC: 5.59 x10E6/uL (ref 4.14–5.80)
RDW: 12.7 % (ref 11.6–15.4)
Sodium: 140 mmol/L (ref 134–144)
T3 Uptake Ratio: 29 % (ref 24–39)
T4, Total: 5.5 ug/dL (ref 4.5–12.0)
TSH: 2.68 u[IU]/mL (ref 0.450–4.500)
Total Protein: 7.6 g/dL (ref 6.0–8.5)
Triglycerides: 63 mg/dL (ref 0–149)
Uric Acid: 6 mg/dL (ref 3.8–8.4)
VLDL Cholesterol Cal: 11 mg/dL (ref 5–40)
WBC: 5 10*3/uL (ref 3.4–10.8)

## 2020-06-18 ENCOUNTER — Other Ambulatory Visit: Payer: Self-pay

## 2020-06-18 ENCOUNTER — Ambulatory Visit: Payer: Self-pay | Admitting: Adult Medicine

## 2020-06-18 ENCOUNTER — Encounter: Payer: Self-pay | Admitting: Adult Medicine

## 2020-06-18 VITALS — BP 117/67 | HR 73 | Temp 97.9°F | Resp 14 | Ht 68.0 in | Wt 172.0 lb

## 2020-06-18 DIAGNOSIS — Z0000 Encounter for general adult medical examination without abnormal findings: Secondary | ICD-10-CM

## 2020-06-18 LAB — POCT URINALYSIS DIPSTICK
Bilirubin, UA: NEGATIVE
Blood, UA: NEGATIVE
Glucose, UA: NEGATIVE
Ketones, UA: NEGATIVE
Leukocytes, UA: NEGATIVE
Nitrite, UA: NEGATIVE
Protein, UA: NEGATIVE
Spec Grav, UA: 1.01 (ref 1.010–1.025)
Urobilinogen, UA: 0.2 E.U./dL
pH, UA: 6.5 (ref 5.0–8.0)

## 2020-06-18 NOTE — Progress Notes (Signed)
I have reviewed the triage vital signs and the nursing notes.   HISTORY  Chief Complaint CC: Annual Exam  HPI Gary Ortiz is a 40 y.o. male   Annual exam other then wt lifting soreness Client reports no health issues. Recently divorced denies sleep, mood, or live stress concerns.    Past Medical History:  Diagnosis Date  . Insomnia     Patient Active Problem List   Diagnosis Date Noted  . Insomnia 08/21/2019  . Arm pain 01/28/2019  . Elbow pain, left 01/08/2019  . Traumatic rupture of biceps tendon 11/24/2016    Past Surgical History:  Procedure Laterality Date  . ELBOW SURGERY    . LASIK    . VASECTOMY     medications   Medication Sig Start Date End Date Taking? Authorizing Provider  traZODone (DESYREL) 100 MG tablet TAKE ONE TABLET AT BEDTIME AS NEEDED FORINSOMNIA 10/10/19  Yes Joni Reining, PA-C    Allergies Patient has no known allergies.  History reviewed. No pertinent family history.  Social History Social History   Tobacco Use  . Smoking status: Never Smoker  . Smokeless tobacco: Never Used  Vaping Use  . Vaping Use: Never used  Substance Use Topics  . Alcohol use: Yes    Review of Systems Constitutional: No fever/chills Eyes: No visual changes. ENT: No sore throat. Cardiovascular: Denies chest pain. Respiratory: Denies shortness of breath. Gastrointestinal: No abdominal pain.  No nausea, no vomiting.  No diarrhea.  No constipation. Genitourinary: Negative for dysuria. Musculoskeletal: Negative for back pain. Skin: Negative for rash. Neurological: Negative for headaches, focal weakness or numbness. Psychiatric: stable mood ___________________________________   PHYSICAL EXAM:  VITAL SIGNS: Constitutional: Alert and oriented. Well appearing and in no acute distress. Eyes: Conjunctivae are normal. PERRL. EOMI. Head: Atraumatic. Nose: No congestion/rhinnorhea. Mouth/Throat: Mucous membranes are moist.  Oropharynx  non-erythematous. Neck: No stridor.  No cervical spine tenderness to palpation.No cervical lymphadenopathy. Cardiovascular: Normal rate, regular rhythm. Grossly normal heart sounds.  Good peripheral circulation. Respiratory: Normal respiratory effort.  No retractions. Lungs CTAB. Gastrointestinal: Soft and nontender. No distention. No abdominal bruits. No CVA tenderness. Genitourinary: 2 scrotal nl size testes, no hernia Musculoskeletal: No lower extremity tenderness nor edema.  No joint effusions. Neurologic:  Normal speech and language. No gross focal neurologic deficits are appreciated. No gait instability. Skin:  Skin is warm, dry and intact. No rash noted. Psychiatric: Mood and affect are normal. Speech and behavior are normal.  ___________________________________ LABS Color, UA yellow pH, UA 6.5 C  Clarity, UA clear Protein, UA Negative  Glucose, UA Negative Urobilinogen, UA 0.2 E.U./dL  Bilirubin, UA negative Nitrite, UA negative  Ketones, UA negative Leukocytes, UA Negative  Spec Grav, UA 1.010 C Appearance light C  Blood, UA negative Odor --       Glucose 104H mg/dL Chol/HDL Ratio 2.6 ratio   Uric Acid 6.0 mg/dL  Estimated CHD Risk < 0.5 times avg.   BUN 12 mg/dL TSH 5.329 uIU/mL  Creatinine, Ser 1.27 mg/dL T4, Total 5.5 ug/dL  GFR calc non Af Amer 70 mL/min/1.73 T3 Uptake Ratio 29 %  GFR calc Af Amer 81 mL/min/1.73  Free Thyroxine Index 1.6  BUN/Creatinine Ratio 9 Prostate Specific Ag, Serum 0.6 ng/mL   Sodium 140 mmol/L WBC 5.0 x10E3/uL  Potassium 4.9 mmol/L RBC 5.59 x10E6/uL  Chloride 101 mmol/L Hemoglobin 16.2 g/dL  Calcium 9.6 mg/dL Hematocrit 92.4 %  Phosphorus 3.2 mg/dL MCV 88 fL  Total Protein 7.6 g/dL MCH 29.0  pg  Albumin 4.8 g/dL MCHC 09.3 g/dL  Globulin, Total 2.8 g/dL RDW 23.5 %  Albumin/Globulin Ratio 1.7 Platelets 243 x10E3/uL  Bilirubin Total 0.6 mg/dL Neutrophils 49 %  Alkaline Phosphatase 77 IU/L Lymphs 40 %  LDH 165 IU/L Monocytes 7 %  AST 25 IU/L  Eos 2 %  ALT 13 IU/L Basos 1 %  GGT 12 IU/L Neutrophils Absolute 2.5 x10E3/uL  Iron 106 ug/dL Lymphocytes Absolute 2.0 x10E3/uL  Cholesterol, Total 208Hi mg/dL Monocytes Absolute 0.4 x10E3/uL  Triglycerides 63 mg/dL EOS (ABSOLUTE) 0.1 T73U2/GU  HDL 81 mg/dL Basophils Absolute 0.0 x10E3/uL  VLDL Cholesterol Cal 11 mg/dL Immature Granulocytes 1 %  LDL Chol Calc (NIH) 116Hi mg/dL       Ekg nsr nl axis morphology interval no acute changes  INITIAL IMPRESSION / ASSESSMENT  Well Annual exam Discussed mood, sleep cycle, job stress and appears all area stable since finalizing divorce Discussed nl labs with chol/hdl ratio very good. Avoid simple sugar snacks eat regular meal on time with in between fruit Or protein shakes  Rtn for annual exam 11yr

## 2020-07-24 DIAGNOSIS — K648 Other hemorrhoids: Secondary | ICD-10-CM | POA: Diagnosis not present

## 2020-09-16 ENCOUNTER — Other Ambulatory Visit: Payer: Self-pay

## 2020-09-16 DIAGNOSIS — Z20822 Contact with and (suspected) exposure to covid-19: Secondary | ICD-10-CM

## 2020-09-16 DIAGNOSIS — R509 Fever, unspecified: Secondary | ICD-10-CM

## 2020-09-16 LAB — POCT INFLUENZA A/B
Influenza A, POC: NEGATIVE
Influenza B, POC: NEGATIVE

## 2020-09-16 NOTE — Progress Notes (Signed)
Presents to [facility] for on-site outdoor specimen collection for covid test & influenza test.  S/Sx since 09/14/20: Temp of 101  Body aches - may be gym related Sinus drainage - allergies have been bad  Unvaccinated  Has Mychart  AMD

## 2020-09-17 LAB — NOVEL CORONAVIRUS, NAA: SARS-CoV-2, NAA: DETECTED — AB

## 2020-09-17 LAB — SARS-COV-2, NAA 2 DAY TAT

## 2020-09-18 ENCOUNTER — Telehealth: Payer: Self-pay | Admitting: Oncology

## 2020-09-18 ENCOUNTER — Encounter: Payer: Self-pay | Admitting: Oncology

## 2020-09-18 ENCOUNTER — Other Ambulatory Visit: Payer: Self-pay | Admitting: Oncology

## 2020-09-18 ENCOUNTER — Telehealth: Payer: Self-pay | Admitting: Physician Assistant

## 2020-09-18 ENCOUNTER — Other Ambulatory Visit: Payer: Self-pay

## 2020-09-18 MED ORDER — PSEUDOEPH-BROMPHEN-DM 30-2-10 MG/5ML PO SYRP: 5.0000 mL | ORAL_SOLUTION | Freq: Four times a day (QID) | ORAL | 0 refills | Status: DC | PRN

## 2020-09-18 MED ORDER — METHYLPREDNISOLONE 4 MG PO TBPK: ORAL_TABLET | ORAL | 0 refills | Status: DC

## 2020-09-18 MED ORDER — MOLNUPIRAVIR EUA 200MG CAPSULE: 4.0000 | ORAL_CAPSULE | Freq: Two times a day (BID) | ORAL | 0 refills | Status: AC

## 2020-09-18 MED ORDER — LIDOCAINE VISCOUS HCL 2 % MT SOLN: 5.0000 mL | Freq: Four times a day (QID) | OROMUCOSAL | 0 refills | Status: DC | PRN

## 2020-09-18 NOTE — Telephone Encounter (Signed)
Called to Discuss with patient about Covid symptoms and the use of oral antiviral medications such as Paxlovid and Molnupiravir for those with mild to moderate Covid symptoms and at a high risk of hospitalization.     Pt is qualified for this infusion due to co-morbid conditions and/or a member of an at-risk group.     Patient Active Problem List   Diagnosis Date Noted  . Insomnia 08/21/2019  . Arm pain 01/28/2019  . Elbow pain, left 01/08/2019  . Traumatic rupture of biceps tendon 11/24/2016    Patient declines infusion at this time. Symptoms tier reviewed as well as criteria for ending isolation. Preventative practices reviewed. Patient verbalized understanding.   Patient advised to call back if he/she decides that he/she does want to get infusion. Callback number to the infusion center given. Patient advised to go to Urgent care or ED with severe symptoms.   Durenda Hurt, NP 09/18/2020 12:03 PM

## 2020-09-18 NOTE — Progress Notes (Signed)
   Subjective: Viral illness    Patient ID: Gary Ortiz, male    DOB: Oct 14, 1980, 40 y.o.   MRN: [id number]  HPI Patient test positive COVID-19 4 days ago.  Patient is complaining of body aches, sore throat, and productive cough.   Review of Systems Insomnia    Objective:   Physical Exam  Physical exam was deferred secondary to this being a teleconference visit.      Assessment & Plan: Viral illness  Discussed conservative treatment.  Patient also given a prescription for Bromfed-DM, viscous lidocaine, and a Medrol Dosepak to use as directed.  Follow-up in 3 days.

## 2020-09-18 NOTE — Progress Notes (Signed)
Pt symptoms started 16 but got worse on the 18. Pt having severe sore throat that he has tried 4 otc meds that are not working. That's the wose symptom he has at the moment. Other symptoms are diarrhea, hard cough and body aches and pt has had a fever. CL,RMA

## 2020-09-18 NOTE — Telephone Encounter (Signed)
error 

## 2020-09-18 NOTE — Progress Notes (Signed)
Outpatient Oral COVID Treatment Note  I connected with Gary Ortiz on 09/18/2020/1:05 PM by telephone and verified that I am speaking with the correct person using two identifiers.  I discussed the limitations, risks, security, and privacy concerns of performing an evaluation and management service by telephone and the availability of in person appointments. I also discussed with the patient that there may be a patient responsible charge related to this service. The patient expressed understanding and agreed to proceed.  Patient location: Home Provider location: Clinic   Diagnosis: COVID-19 infection  Purpose of visit: Discussion of potential use of Molnupiravir or Paxlovid, a new treatment for mild to moderate COVID-19 viral infection in non-hospitalized patients.   Subjective: Patient is a 40 y.o. male who has been diagnosed with COVID 19 viral infection.  Their symptoms began on 09/14/20.  Past Medical History:  Diagnosis Date  . Insomnia     No Known Allergies   Current Outpatient Medications:  .  molnupiravir EUA 200 mg CAPS, Take 4 capsules (800 mg total) by mouth 2 (two) times daily for 5 days., Disp: 40 capsule, Rfl: 0 .  brompheniramine-pseudoephedrine-DM 30-2-10 MG/5ML syrup, Take 5 mLs by mouth 4 (four) times daily as needed. Mix with 27mL viscous lidocaine swish and swallow., Disp: 120 mL, Rfl: 0 .  lidocaine (XYLOCAINE) 2 % solution, Use as directed 5 mLs in the mouth or throat every 6 (six) hours as needed for mouth pain. Mix with 5 mL of Bromfed-DM for swish and swallow, Disp: 100 mL, Rfl: 0 .  methylPREDNISolone (MEDROL DOSEPAK) 4 MG TBPK tablet, Take Tapered dose as directed, Disp: 21 tablet, Rfl: 0 .  traZODone (DESYREL) 100 MG tablet, TAKE ONE TABLET AT BEDTIME AS NEEDED FORINSOMNIA, Disp: 60 tablet, Rfl: 2  Objective: Patient sounds well.  They are in no apparent distress.  Breathing is non labored.  Mood and behavior are normal.  Laboratory Data:  Recent  Results (from the past 2160 hour(s))  Novel Coronavirus, NAA ([facility])     Status: Abnormal   Collection Time: 09/16/20  8:51 AM   Specimen: Nasopharyngeal(NP) swabs in vial transport medium   Nasopharynge  Result Value Ref Range   SARS-CoV-2, NAA Detected (A) Not Detected    Comment: Patients who have a positive COVID-19 test result may now have treatment options. Treatment options are available for patients with mild to moderate symptoms and for hospitalized patients. Visit our website at [email or web address] for resources and information. This nucleic acid amplification test was developed and its performance characteristics determined by [employer]. Nucleic acid amplification tests include RT-PCR and TMA. This test has not been FDA cleared or approved. This test has been authorized by FDA under an Emergency Use Authorization (EUA). This test is only authorized for the duration of time the declaration that circumstances exist justifying the authorization of the emergency use of in vitro diagnostic tests for detection of SARS-CoV-2 virus and/or diagnosis of COVID-19 infection under section 564(b)(1) of the Act, 21 U.S.C. 588TGP-4(D) (1), unless the authorization is terminated or revoked sooner. When diagnostic testing is negativ e, the possibility of a false negative result should be considered in the context of a patient's recent exposures and the presence of clinical signs and symptoms consistent with COVID-19. An individual without symptoms of COVID-19 and who is not shedding SARS-CoV-2 virus would expect to have a negative (not detected) result in this assay.   SARS-COV-2, NAA 2 DAY TAT     Status: None   Collection Time:  09/16/20  8:51 AM   Nasopharynge  Result Value Ref Range   SARS-CoV-2, NAA 2 DAY TAT Performed   POCT Influenza A/B     Status: None   Collection Time: 09/16/20  9:30 AM  Result Value Ref Range   Influenza A, POC Negative Negative    Influenza B, POC Negative Negative     Assessment: 40 y.o. male with mild/moderate COVID 19 viral infection diagnosed on 09/16/20 at high risk for progression to severe COVID 19.  Plan:  This patient is a 40 y.o. male that meets the following criteria for Emergency Use Authorization of: Molnupiravir  1. Age >18 yr 2. SARS-COV-2 positive test 3. Symptom onset < 5 days 4. Mild-to-moderate COVID disease with high risk for severe progression to hospitalization or death   I have spoken and communicated the following to the patient or parent/caregiver regarding: 1. Molnupiravir is an unapproved drug that is authorized for use under an TEFL teacher.  2. There are no adequate, approved, available products for the treatment of COVID-19 in adults who have mild-to-moderate COVID-19 and are at high risk for progressing to severe COVID-19, including hospitalization or death. 3. Other therapeutics are currently authorized. For additional information on all products authorized for treatment or prevention of COVID-19, please see https://[email or web address].graham-[email or web address]/.  4. There are benefits and risks of taking this treatment as outlined in the "Fact Sheet for Patients and Caregivers."  5. "Fact Sheet for Patients and Caregivers" was reviewed with patient. A hard copy will be provided to patient from pharmacy prior to the patient receiving treatment. 6. Patients should continue to self-isolate and use infection control measures (e.g., wear mask, isolate, social distance, avoid sharing personal items, clean and disinfect "high touch" surfaces, and frequent handwashing) according to CDC guidelines.  7. The patient or parent/caregiver has the option to accept or refuse treatment. 8. [employer] has established a pregnancy surveillance program. 9. Females of childbearing potential should use a reliable method  of contraception correctly and consistently, as applicable, for the duration of treatment and for 4 days after the last dose of Molnupiravir. 10. Males of reproductive potential who are sexually active with females of childbearing potential should use a reliable method of contraception correctly and consistently during treatment and for at least 3 months after the last dose. 11. Pregnancy status and risk was assessed. Patient verbalized understanding of precautions.   After reviewing above information with the patient, the patient agrees to receive molnupiravir.  Follow up instructions:    . Take prescription BID x 5 days as directed . Reach out to pharmacist for counseling on medication if desired . For concerns regarding further COVID symptoms please follow up with your PCP or urgent care . For urgent or life-threatening issues, seek care at your local emergency department  The patient was provided an opportunity to ask questions, and all were answered. The patient agreed with the plan and demonstrated an understanding of the instructions.   [facility]   The patient was advised to call their PCP or seek an in-person evaluation if the symptoms worsen or if the condition fails to improve as anticipated.   I provided 15 minutes of non face-to-face telephone visit time during this encounter, and > 50% was spent counseling as documented under my assessment & plan.  Mauro Kaufmann, NP 09/18/2020 Lowanda Foster PM

## 2020-11-05 ENCOUNTER — Other Ambulatory Visit: Payer: Self-pay | Admitting: Physician Assistant

## 2020-11-05 DIAGNOSIS — G4700 Insomnia, unspecified: Secondary | ICD-10-CM

## 2020-11-09 ENCOUNTER — Other Ambulatory Visit: Payer: Self-pay

## 2020-11-09 NOTE — Telephone Encounter (Signed)
Received a faxed copy of refill request from [facility] for Trazodone 100 mg.  When I opened up Rx refills in Epic, there was already an electronic refill request in Epic from the pharmacy.  Re-routed to [facility], [facility].  AMD

## 2020-11-11 ENCOUNTER — Other Ambulatory Visit: Payer: Self-pay

## 2020-11-11 ENCOUNTER — Ambulatory Visit: Payer: Self-pay | Admitting: Physician Assistant

## 2020-11-11 ENCOUNTER — Encounter: Payer: Self-pay | Admitting: Physician Assistant

## 2020-11-11 VITALS — BP 125/64 | HR 86 | Temp 97.8°F | Resp 12

## 2020-11-11 DIAGNOSIS — S3022XA Contusion of scrotum and testes, initial encounter: Secondary | ICD-10-CM

## 2020-11-11 DIAGNOSIS — N5082 Scrotal pain: Secondary | ICD-10-CM

## 2020-11-11 LAB — POCT URINALYSIS DIPSTICK
Bilirubin, UA: NEGATIVE
Blood, UA: NEGATIVE
Glucose, UA: NEGATIVE
Ketones, UA: NEGATIVE
Leukocytes, UA: NEGATIVE
Nitrite, UA: NEGATIVE
Protein, UA: NEGATIVE
Spec Grav, UA: 1.01 (ref 1.010–1.025)
Urobilinogen, UA: 0.2 E.U./dL
pH, UA: 6 (ref 5.0–8.0)
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
## 2021-01-18 DIAGNOSIS — J019 Acute sinusitis, unspecified: Secondary | ICD-10-CM | POA: Diagnosis not present

## 2021-04-14 ENCOUNTER — Ambulatory Visit: Payer: Self-pay

## 2021-04-14 ENCOUNTER — Other Ambulatory Visit: Payer: Self-pay

## 2021-04-14 DIAGNOSIS — Z0000 Encounter for general adult medical examination without abnormal findings: Secondary | ICD-10-CM

## 2021-04-14 LAB — POCT URINALYSIS DIPSTICK
Bilirubin, UA: NEGATIVE
Blood, UA: NEGATIVE
Glucose, UA: NEGATIVE
Ketones, UA: NEGATIVE
Leukocytes, UA: NEGATIVE
Nitrite, UA: NEGATIVE
Protein, UA: NEGATIVE
Spec Grav, UA: <=1.005 — AB (ref 1.010–1.025)
Urobilinogen, UA: 0.2 E.U./dL
pH, UA: 6.5 (ref 5.0–8.0)

## 2021-04-14 NOTE — Progress Notes (Signed)
04/20/21 annual physical scheduled.

## 2021-04-15 LAB — CMP12+LP+TP+TSH+6AC+PSA+CBC…
ALT: 13 IU/L (ref 0–44)
AST: 24 IU/L (ref 0–40)
Albumin/Globulin Ratio: 1.7 (ref 1.2–2.2)
Albumin: 4.7 g/dL (ref 4.0–5.0)
Alkaline Phosphatase: 79 IU/L (ref 44–121)
BUN/Creatinine Ratio: 14 (ref 9–20)
BUN: 16 mg/dL (ref 6–24)
Basophils Absolute: 0.1 10*3/uL (ref 0.0–0.2)
Basos: 1 %
Bilirubin Total: 0.6 mg/dL (ref 0.0–1.2)
Calcium: 9.8 mg/dL (ref 8.7–10.2)
Chloride: 97 mmol/L (ref 96–106)
Chol/HDL Ratio: 2.4 ratio (ref 0.0–5.0)
Cholesterol, Total: 210 mg/dL — ABNORMAL HIGH (ref 100–199)
Creatinine, Ser: 1.16 mg/dL (ref 0.76–1.27)
EOS (ABSOLUTE): 0.2 10*3/uL (ref 0.0–0.4)
Eos: 3 %
Estimated CHD Risk: <0.5 times avg. (ref 0.0–1.0)
Free Thyroxine Index: 1.5 (ref 1.2–4.9)
GGT: 16 IU/L (ref 0–65)
Globulin, Total: 2.7 g/dL (ref 1.5–4.5)
Glucose: 91 mg/dL (ref 70–99)
HDL: 86 mg/dL (ref >39)
Hematocrit: 45.7 % (ref 37.5–51.0)
Hemoglobin: 15.5 g/dL (ref 13.0–17.7)
Immature Grans (Abs): 0 10*3/uL (ref 0.0–0.1)
Immature Granulocytes: 0 %
Iron: 135 ug/dL (ref 38–169)
LDH: 162 IU/L (ref 121–224)
LDL Chol Calc (NIH): 107 mg/dL — ABNORMAL HIGH (ref 0–99)
Lymphocytes Absolute: 1.9 10*3/uL (ref 0.7–3.1)
Lymphs: 33 %
MCH: 29.4 pg (ref 26.6–33.0)
MCHC: 33.9 g/dL (ref 31.5–35.7)
MCV: 87 fL (ref 79–97)
Monocytes Absolute: 0.4 10*3/uL (ref 0.1–0.9)
Monocytes: 7 %
Neutrophils Absolute: 3.2 10*3/uL (ref 1.4–7.0)
Neutrophils: 56 %
Phosphorus: 3.3 mg/dL (ref 2.8–4.1)
Platelets: 246 10*3/uL (ref 150–450)
Potassium: 4.6 mmol/L (ref 3.5–5.2)
Prostate Specific Ag, Serum: 1.5 ng/mL (ref 0.0–4.0)
RBC: 5.28 x10E6/uL (ref 4.14–5.80)
RDW: 13.3 % (ref 11.6–15.4)
Sodium: 138 mmol/L (ref 134–144)
T3 Uptake Ratio: 28 % (ref 24–39)
T4, Total: 5.4 ug/dL (ref 4.5–12.0)
TSH: 1.87 u[IU]/mL (ref 0.450–4.500)
Total Protein: 7.4 g/dL (ref 6.0–8.5)
Triglycerides: 96 mg/dL (ref 0–149)
Uric Acid: 5.9 mg/dL (ref 3.8–8.4)
VLDL Cholesterol Cal: 17 mg/dL (ref 5–40)
WBC: 5.8 10*3/uL (ref 3.4–10.8)
eGFR: 81 mL/min/{1.73_m2} (ref >59)

## 2021-04-15 LAB — ABO/RH: Rh Factor: POSITIVE

## 2021-04-20 ENCOUNTER — Other Ambulatory Visit: Payer: Self-pay

## 2021-04-20 ENCOUNTER — Ambulatory Visit: Payer: 59 | Admitting: Physician Assistant

## 2021-04-20 ENCOUNTER — Encounter: Payer: Self-pay | Admitting: Physician Assistant

## 2021-04-20 VITALS — BP 115/81 | HR 78 | Temp 98.4°F | Resp 14 | Ht 68.0 in | Wt 176.0 lb

## 2021-04-20 DIAGNOSIS — Z0000 Encounter for general adult medical examination without abnormal findings: Secondary | ICD-10-CM

## 2021-04-20 NOTE — Progress Notes (Signed)
Pt denies any issues or concerns at this time/CL,RMA ?

## 2021-04-20 NOTE — Progress Notes (Signed)
[place]    ____________________________________________   None    (approximate)  I have reviewed the triage vital signs and the nursing notes.   HISTORY  Chief Complaint Annual Exam    HPI Gary Ortiz is a 40 y.o. male patient presents for annual physical exam voices no concerns or complaints.         Past Medical History:  Diagnosis Date   Insomnia     Patient Active Problem List   Diagnosis Date Noted   Insomnia 08/21/2019   Arm pain 01/28/2019   Elbow pain, left 01/08/2019   Traumatic rupture of biceps tendon 11/24/2016    Past Surgical History:  Procedure Laterality Date   ELBOW SURGERY     LASIK     VASECTOMY      Prior to Admission medications   Medication Sig Start Date End Date Taking? Authorizing Provider  traZODone (DESYREL) 100 MG tablet TAKE ONE TABLET AT BEDTIME AS NEEDED FOR INSOMNIA 11/09/20  Yes Sable Feil, PA-C    Allergies Patient has no known allergies.  History reviewed. No pertinent family history.  Social History Social History   Tobacco Use   Smoking status: Never   Smokeless tobacco: Never  Vaping Use   Vaping Use: Never used  Substance Use Topics   Alcohol use: Yes    Review of Systems  Constitutional: No fever/chills Eyes: No visual changes. ENT: No sore throat. Cardiovascular: Denies chest pain. Respiratory: Denies shortness of breath. Gastrointestinal: No abdominal pain.  No nausea, no vomiting.  No diarrhea.  No constipation. Genitourinary: Negative for dysuria. Musculoskeletal: Negative for back pain. Skin: Negative for rash. Neurological: Negative for headaches, focal weakness or numbness. ____________________________________________   PHYSICAL EXAM:  VITAL SIGNS:  Today's Problems  Annual Exam     Select Method of Visit   Review Patient History   Go to Quick History Navigator   Today's Vitals  Date and Time Temp Pulse RR BP SpO2 Weight BMI Who  04/20/21 0808 98.4  F (36.9 C) 78 14 115/81 99 % 176 lb (79.8 kg) 26.8 CFL    See historical vitals   Today's Labs & Orders  None   Medications              traZODone (DESYREL) 100 MG tablet TAKE ONE TABLET AT BEDTIME AS NEEDED FOR INSOMNIA     Mark as Reviewed Reviewed by Hazle Coca, RMA at 8:08 AM EST  Jump to medication reconciliation   Health Maintenance due or due soon     Topic Due Completion Date  HIV Screening Never done ---  Hepatitis C Screening Never done ---  INFLUENZA VACCINE Never done ---  TETANUS/TDAP 02/20/2021 02/21/2011        BestPractice Advisories  Influenza vaccine due. Order the immunization through the SmartSet, document historical immunizations in the Immunizations activity, use Postpone to document a reason for not immunizing today, or add the exclusion modifier to permanently remove from influenza plan.    If patient allergic to the vaccine, use hyperlink below to document allergies and add HM modifier "Not a candidate for influenza".  If patient declines or is ill use Postpone to set new date for vaccination reminder. To decline for the season, select Postpone and set date to 07/30/2021(end date of Influenza Plan).   Additional Information Follow-ups   Immunizations - Document Historical Immunization   CDC Immunization Guidelines Open SmartSet: Influenza Vaccine HM postpone: INFLUENZA VACCINE Add HM modifier: Not a candidate  for influenza vaccine          This patient is 18+ with a BMI > or = 25 indicating the patient is overweight. Please document your clinical recommendations and intended follow up now.    Estimated body mass index is 26.76 kg/m as calculated from the following:   Height as of this encounter: '5\' 8"'  (1.727 m).   Weight as of this encounter: 176 lb (79.8 kg).     Additional Information Follow-ups   Document Follow Up Option/Contraindication Open SmartSet: [facility] AMB Qual Weight Management       Constitutional: Alert and  oriented. Well appearing and in no acute distress. Eyes: Conjunctivae are normal. PERRL. EOMI. Head: Atraumatic. Nose: No congestion/rhinnorhea. Mouth/Throat: Mucous membranes are moist.  Oropharynx non-erythematous. Neck: No stridor.  No cervical spine tenderness to palpation. Hematological/Lymphatic/Immunilogical: No cervical lymphadenopathy. Cardiovascular: Normal rate, regular rhythm. Grossly normal heart sounds.  Good peripheral circulation. Respiratory: Normal respiratory effort.  No retractions. Lungs CTAB. Gastrointestinal: Soft and nontender. No distention. No abdominal bruits. No CVA tenderness. Genitourinary: Deferred Musculoskeletal: No lower extremity tenderness nor edema.  No joint effusions. Neurologic:  Normal speech and language. No gross focal neurologic deficits are appreciated. No gait instability. Skin:  Skin is warm, dry and intact. No rash noted. Psychiatric: Mood and affect are normal. Speech and behavior are normal.  ____________________________________________   LABS (all labs ordered are listed, but only abnormal results are displayed)  Component Ref Range & Units 6 d ago  (04/14/21) 10 mo ago  (06/16/20) 1 yr ago  (05/29/19) 7 yr ago  (03/01/14) 7 yr ago  (03/01/14)  Glucose 70 - 99 mg/dL 91  104 High  R  92 R  89 R    Uric Acid 3.8 - 8.4 mg/dL 5.9  6.0 CM  5.5 CM     Comment:            Therapeutic target for gout patients: <6.0  BUN 6 - 24 mg/dL '16  12  13 ' R  16 R    Creatinine, Ser 0.76 - 1.27 mg/dL 1.16  1.27  1.12  1.87 High  R    eGFR >59 mL/min/1.73 81       BUN/Creatinine Ratio 9 - '20 14  9  12     ' Sodium 134 - 144 mmol/L 138  140  138  143 R    Potassium 3.5 - 5.2 mmol/L 4.6  4.9  4.7  3.8 R    Chloride 96 - 106 mmol/L 97  101  99  108 High  R    Calcium 8.7 - 10.2 mg/dL 9.8  9.6  9.3  8.3 Low  R    Phosphorus 2.8 - 4.1 mg/dL 3.3  3.2  3.3     Total Protein 6.0 - 8.5 g/dL 7.4  7.6  7.0  6.6 R    Albumin 4.0 - 5.0 g/dL 4.7  4.8  4.5  3.6 R     Globulin, Total 1.5 - 4.5 g/dL 2.7  2.8  2.5     Albumin/Globulin Ratio 1.2 - 2.2 1.7  1.7  1.8     Bilirubin Total 0.0 - 1.2 mg/dL 0.6  0.6  0.5  0.3 R    Alkaline Phosphatase 44 - 121 IU/L 79  77  86 R  74 R, CM    LDH 121 - 224 IU/L 162  165  184     AST 0 - 40 IU/L 24  25  21  20 R    ALT 0 - 44 IU/L '13  13  11  17 ' R, CM    GGT 0 - 65 IU/L '16  12  11     ' Iron 38 - 169 ug/dL 135  106  126     Cholesterol, Total 100 - 199 mg/dL 210 High   208 High   192     Triglycerides 0 - 149 mg/dL 96  63  129     HDL >39 mg/dL 86  81  59     VLDL Cholesterol Cal 5 - 40 mg/dL '17  11  23     ' LDL Chol Calc (NIH) 0 - 99 mg/dL 107 High   116 High   110 High      Chol/HDL Ratio 0.0 - 5.0 ratio 2.4  2.6 CM  3.3 CM     Comment:                                   T. Chol/HDL Ratio                                              Men  Women                                1/2 Avg.Risk  3.4    3.3                                    Avg.Risk  5.0    4.4                                 2X Avg.Risk  9.6    7.1                                 3X Avg.Risk 23.4   11.0   Estimated CHD Risk 0.0 - 1.0 times avg.  < 0.5   < 0.5 CM   < 0.5 CM     Comment: The CHD Risk is based on the T. Chol/HDL ratio. Other  factors affect CHD Risk such as hypertension, smoking,  diabetes, severe obesity, and family history of  premature CHD.   TSH 0.450 - 4.500 uIU/mL 1.870  2.680  2.410     T4, Total 4.5 - 12.0 ug/dL 5.4  5.5  4.9     T3 Uptake Ratio 24 - 39 % '28  29  27     ' Free Thyroxine Index 1.2 - 4.9 1.5  1.6  1.3     Prostate Specific Ag, Serum 0.0 - 4.0 ng/mL 1.5  0.6 CM  0.5 CM     Comment: Roche ECLIA methodology.  According to the American Urological Association, Serum PSA should  decrease and remain at undetectable levels after radical  prostatectomy. The AUA defines biochemical recurrence as an initial  PSA value 0.2 ng/mL or greater followed by a subsequent confirmatory  PSA value 0.2 ng/mL or greater.  Values  obtained with different assay methods or kits cannot be  used  interchangeably. Results cannot be interpreted as absolute evidence  of the presence or absence of malignant disease.   WBC 3.4 - 10.8 x10E3/uL 5.8  5.0  6.8   7.0 R   RBC 4.14 - 5.80 x10E6/uL 5.28  5.59  5.59   4.98 R   Hemoglobin 13.0 - 17.7 g/dL 15.5  16.2  16.4     Hematocrit 37.5 - 51.0 % 45.7  49.0  46.8     MCV 79 - 97 fL 87  88  84   85 R   MCH 26.6 - 33.0 pg 29.4  29.0  29.3   28.8 R   MCHC 31.5 - 35.7 g/dL 33.9  33.1  35.0   34.0 R   RDW 11.6 - 15.4 % 13.3  12.7  12.7   12.8 R   Platelets 150 - 450 x10E3/uL 246  243  230   245 R   Neutrophils Not Estab. % 56  49  59     Lymphs Not Estab. % 33  40  30     Monocytes Not Estab. % '7  7  7     ' Eos Not Estab. % '3  2  3     ' Basos Not Estab. % '1  1  1     ' Neutrophils Absolute 1.4 - 7.0 x10E3/uL 3.2  2.5  4.1     Lymphocytes Absolute 0.7 - 3.1 x10E3/uL 1.9  2.0  2.0     Monocytes Absolute 0.1 - 0.9 x10E3/uL 0.4  0.4  0.5     EOS (ABSOLUTE) 0.0 - 0.4 x10E3/uL 0.2  0.1  0.2     Basophils Absolute 0.0 - 0.2 x10E3/uL 0.1  0.0  0.0     Immature Granulocytes Not Estab. % 0  1  0     Immature Grans            ____________________________________________  EKG   ____________________________________________  RADIOLOGY I, Sable Feil, personally viewed and evaluated these images (plain radiographs) as part of my medical decision making, as well as reviewing the written report by the radiologist.  ED MD interpretation:    Official radiology report(s): No results found.  ____________________________________________   PROCEDURES  Procedure(s) performed (including Critical Care):  Procedures   ____________________________________________   INITIAL IMPRESSION / ASSESSMENT AND PLAN / ED COURSE  As part of my medical decision making, I reviewed the following data within the electronic MEDICAL RECORD NUMBER  Discussed mild increase in cholesterol advised diet and  exercise.            ____________________________________________   FINAL CLINICAL IMPRESSION(S) / ED DIAGNOSES  Well exam   ED Discharge Orders     None        Note:  This document was prepared using Dragon voice recognition software and may include unintentional dictation errors.

## 2021-04-21 ENCOUNTER — Encounter: Payer: 59 | Admitting: Physician Assistant

## 2021-05-10 ENCOUNTER — Ambulatory Visit: Payer: 59 | Admitting: Physician Assistant

## 2021-05-24 DIAGNOSIS — J3489 Other specified disorders of nose and nasal sinuses: Secondary | ICD-10-CM | POA: Diagnosis not present

## 2021-06-14 ENCOUNTER — Other Ambulatory Visit: Payer: Self-pay | Admitting: Physician Assistant

## 2021-06-14 DIAGNOSIS — G4700 Insomnia, unspecified: Secondary | ICD-10-CM

## 2021-06-16 ENCOUNTER — Other Ambulatory Visit: Payer: Self-pay

## 2021-06-16 NOTE — Telephone Encounter (Signed)
Khmari called [facility] requesting Rx refill for Trazodone 10 mg.  Upon entering Rx refill info into Epic, a pended Rx refill request for Trazodone was already in Epic sent electronically to [facility] ED by patient's pharmacy.  Re-routed the pended Rx refill request to Randel Pigg, PA-C.  AMD

## 2021-06-22 ENCOUNTER — Encounter: Payer: Self-pay | Admitting: Physician Assistant

## 2021-06-22 ENCOUNTER — Ambulatory Visit
Admission: RE | Admit: 2021-06-22 | Discharge: 2021-06-22 | Disposition: A | Discharge status: Home / Self Care | Payer: 59 | Source: Ambulatory Visit | Attending: Physician Assistant | Admitting: Physician Assistant

## 2021-06-22 ENCOUNTER — Other Ambulatory Visit: Payer: Self-pay

## 2021-06-22 ENCOUNTER — Ambulatory Visit: Payer: Self-pay | Admitting: Physician Assistant

## 2021-06-22 ENCOUNTER — Ambulatory Visit
Admission: RE | Admit: 2021-06-22 | Discharge: 2021-06-22 | Disposition: A | Discharge status: Home / Self Care | Payer: 59 | Attending: Physician Assistant | Admitting: Physician Assistant

## 2021-06-22 VITALS — BP 117/67 | HR 77 | Temp 98.6°F | Resp 16 | Ht 68.0 in | Wt 180.0 lb

## 2021-06-22 DIAGNOSIS — R059 Cough, unspecified: Secondary | ICD-10-CM

## 2021-06-22 DIAGNOSIS — R0602 Shortness of breath: Secondary | ICD-10-CM | POA: Diagnosis not present

## 2021-06-22 DIAGNOSIS — R079 Chest pain, unspecified: Secondary | ICD-10-CM | POA: Diagnosis not present

## 2021-06-22 LAB — POC COVID19 BINAXNOW: SARS Coronavirus 2 Ag: NEGATIVE

## 2021-06-22 MED ORDER — HYDROCOD POLI-CHLORPHE POLI ER 10-8 MG/5ML PO SUER: 5.0000 mL | Freq: Two times a day (BID) | ORAL | 0 refills | Status: DC

## 2021-06-22 MED ORDER — METHYLPREDNISOLONE 4 MG PO TBPK: ORAL_TABLET | ORAL | 0 refills | Status: DC

## 2021-06-22 MED ORDER — BENZONATATE 200 MG PO CAPS: 200.0000 mg | ORAL_CAPSULE | Freq: Two times a day (BID) | ORAL | 0 refills | Status: DC | PRN

## 2021-06-22 NOTE — Progress Notes (Signed)
° °  Subjective: Cough    Patient ID: Gary Ortiz, male    DOB: [birth year], 41 y.o.   MRN: [id number]  HPI Patient complain of coughing spells x2 weeks.  Denies recent travel or known contact with COVID-19.  Rapid COVID-19 test was negative.  Patient states at times the cough causes near syncope type episode.  Patient also states nasal congestion but will use a nasal rinse the nose starts to bleed.  Patient has not used nasal rinses in 4 days.  Denies fever chills associated complaint.  Also stated mild nausea secondary to coughing spells.   Review of Systems     Objective:   Physical Exam No acute distress.  Temperature is 98.6, pulse 77, respirations 16, BP is 117/67, patient 97% O2 sat on room air.  Patient weighs 180 pounds and BMI is 27.37.  Patient has productive cough throughout exam. HEENT is unremarkable.  Neck is supple without lymphadenopathy or bruits.  Lungs has mild wheezing and increased cough with deep inspirations.  Heart is regular rate and rhythm.       Assessment & Plan: Cough due to bronchospasm  Advised patient to get a chest x-ray secondary to the duration of his complaint.  Patient given a prescription for Medrol Dosepak, Tussionex, and Tessalon Perles.  Patient advised on drowsy effects of Tussionex.  We will follow-up in 48 hours.

## 2021-06-22 NOTE — Progress Notes (Signed)
Pt presents today with persistent cough for 2 weeks.

## 2021-06-22 NOTE — Progress Notes (Signed)
Pt was sick two weeks ago for 11 days and now he has developed a cough for 5 days now. Pt states he has  vomited a couple times from coughing fits. Sinus continue to bleed in the mornings and using the sinus rinse.

## 2021-06-29 ENCOUNTER — Ambulatory Visit: Payer: Self-pay | Admitting: Physician Assistant

## 2021-06-29 ENCOUNTER — Encounter: Payer: Self-pay | Admitting: Physician Assistant

## 2021-06-29 ENCOUNTER — Other Ambulatory Visit: Payer: Self-pay | Admitting: Physician Assistant

## 2021-06-29 VITALS — BP 122/80 | HR 80 | Resp 16

## 2021-06-29 DIAGNOSIS — R059 Cough, unspecified: Secondary | ICD-10-CM

## 2021-06-29 DIAGNOSIS — R0602 Shortness of breath: Secondary | ICD-10-CM

## 2021-06-29 MED ORDER — ALBUTEROL SULFATE (2.5 MG/3ML) 0.083% IN NEBU: 2.5000 mg | INHALATION_SOLUTION | Freq: Four times a day (QID) | RESPIRATORY_TRACT | 12 refills | Status: DC | PRN

## 2021-06-29 MED ORDER — ALBUTEROL SULFATE HFA 108 (90 BASE) MCG/ACT IN AERS: 2.0000 | INHALATION_SPRAY | Freq: Four times a day (QID) | RESPIRATORY_TRACT | 2 refills | Status: DC | PRN

## 2021-06-29 MED ORDER — ALBUTEROL SULFATE (2.5 MG/3ML) 0.083% IN NEBU
2.5000 mg | INHALATION_SOLUTION | Freq: Once | RESPIRATORY_TRACT | Status: AC
  Administered 2021-06-29: 2.5 mg via RESPIRATORY_TRACT

## 2021-06-29 NOTE — Progress Notes (Signed)
° °  Subjective: Cough    Patient ID: Gary Ortiz, male    DOB: Mar 08, 1981, 41 y.o.   MRN: [id number]  HPI Patient complain of persisting cough for 1 week.  Patient states cough increased with exertion along with dyspnea.  Review chest x-ray taken last week showing no obvious abnormalities.  Previous treatments consisting of Tessalon Perles, Tussionex, and Medrol Dosepak has failed to resolve the cough and dyspnea.  No history of tobacco use.   Review of Systems Insomnia    Objective:   Physical Exam This is a virtual visit.       Assessment & Plan: Persistent cough   Patient will report to clinic today for DuoNeb treatment and reevaluation.  We will consider pulmonology consult if condition persist

## 2021-06-29 NOTE — Progress Notes (Signed)
Cough x3 weeks - non-productive Completed sterroids Sunday Lung capacity feels 1/ to 3/4 of normal  Using meds previously prescribed by Ron - finished up cough syrup last night.  C/O SOB at rest - like in conversation  Chest xray done last week  AMD

## 2021-07-23 ENCOUNTER — Other Ambulatory Visit: Payer: Self-pay

## 2021-07-23 NOTE — Progress Notes (Signed)
Random UDS and ETOH completed. ?

## 2021-11-24 ENCOUNTER — Other Ambulatory Visit: Payer: Self-pay | Admitting: Physician Assistant

## 2021-11-24 DIAGNOSIS — G4700 Insomnia, unspecified: Secondary | ICD-10-CM

## 2022-02-16 ENCOUNTER — Ambulatory Visit: Payer: Self-pay | Admitting: Physician Assistant

## 2022-02-16 ENCOUNTER — Encounter: Payer: Self-pay | Admitting: Physician Assistant

## 2022-02-16 DIAGNOSIS — M25562 Pain in left knee: Secondary | ICD-10-CM

## 2022-02-16 NOTE — Progress Notes (Signed)
   Subjective: Left knee pain    Patient ID: Gary Ortiz, male    DOB: [birth year], 41 y.o.   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  Patient states 95% improvement since onset of injury.   Review of Systems Negative except for chief complaint    Objective:   Physical Exam  Reviewed MRI results and treatment plan from orthopedics. Patient ambulates with normal gait.  Sits and stands without reliance on upper extremities.  No obvious deformity to the left knee.  Full and equal range of motion.  Neurovascular intact.      Assessment & Plan: Left knee pain   Patient return back to a trial of full duties.  Advised to follow-up if condition worsens.

## 2022-02-16 NOTE — Progress Notes (Signed)
Pt presents today for WC follow up. DOI 11-30-21 Left Knee. Pt states atleast 95% better.Burna Sis

## 2022-02-22 ENCOUNTER — Other Ambulatory Visit: Payer: Self-pay

## 2022-02-22 DIAGNOSIS — G4700 Insomnia, unspecified: Secondary | ICD-10-CM

## 2022-02-22 DIAGNOSIS — R0602 Shortness of breath: Secondary | ICD-10-CM

## 2022-02-22 MED ORDER — ALBUTEROL SULFATE HFA 108 (90 BASE) MCG/ACT IN AERS: 2.0000 | INHALATION_SPRAY | Freq: Four times a day (QID) | RESPIRATORY_TRACT | 2 refills | Status: DC | PRN

## 2022-02-22 MED ORDER — TRAZODONE HCL 100 MG PO TABS: ORAL_TABLET | ORAL | 2 refills | Status: DC

## 2022-05-05 ENCOUNTER — Ambulatory Visit: Payer: Self-pay | Admitting: Physician Assistant

## 2022-05-05 ENCOUNTER — Encounter: Payer: Self-pay | Admitting: Physician Assistant

## 2022-05-05 DIAGNOSIS — R0602 Shortness of breath: Secondary | ICD-10-CM

## 2022-05-05 DIAGNOSIS — G4700 Insomnia, unspecified: Secondary | ICD-10-CM

## 2022-05-05 MED ORDER — PROMETHAZINE-DM 6.25-15 MG/5ML PO SYRP: 5.0000 mL | ORAL_SOLUTION | Freq: Four times a day (QID) | ORAL | 0 refills | Status: DC | PRN

## 2022-05-05 MED ORDER — ALBUTEROL SULFATE HFA 108 (90 BASE) MCG/ACT IN AERS: 2.0000 | INHALATION_SPRAY | Freq: Four times a day (QID) | RESPIRATORY_TRACT | 2 refills | Status: DC | PRN

## 2022-05-05 MED ORDER — TRAZODONE HCL 100 MG PO TABS: ORAL_TABLET | ORAL | 2 refills | Status: DC

## 2022-05-05 MED ORDER — METHYLPREDNISOLONE 4 MG PO TBPK: ORAL_TABLET | ORAL | 0 refills | Status: DC

## 2022-05-05 NOTE — Progress Notes (Signed)
   Subjective: Cough and wheezing    Patient ID: Gary Ortiz, male    DOB: 07/16/1980, 42 y.o.   MRN: [id number]  HPI  Patient complain of 1 week of cough and wheezing.  Patient states onset of complaint with changing weather/temperature.  Also has postnasal drainage.  Patient states mild dyspnea with exertion.  Denies fever/chills.  No recent travel or known contact with COVID-19.  Patient tested negative for COVID-19 and influenza earlier today.  Review of Systems Negative except for chief complaint    Objective:   Physical Exam  BP is 121/89, pulse 88, respiration 12, temperature 98, patient 90% O2 sat on room air.  Patient with 187 pounds and BMI is 24.43. HEENT is remarkable for postnasal drainage and cough. Neck is supple for lymphadenopathy or bruits. Lungs with mild wheezing and increased cough with deep inspiration.      Assessment & Plan: Cough due to bronchospasm   Patient given nebulized treatment prior to departure.  Patient given prescription for Phenergan DM, Medrol Dosepak, and Ventolin inhaler.  Patient advised to follow-up in 1 week if no improvement or worsening complaint.

## 2022-05-05 NOTE — Progress Notes (Signed)
Cough and drainage for 1 week, requesting breathing treatment. Pt stating he's very short of breath. Pt also requesting refills on trazodone and albuterol inhaler.

## 2022-05-10 ENCOUNTER — Other Ambulatory Visit: Payer: Self-pay | Admitting: Physician Assistant

## 2022-05-10 MED ORDER — PROMETHAZINE-DM 6.25-15 MG/5ML PO SYRP: 5.0000 mL | ORAL_SOLUTION | Freq: Four times a day (QID) | ORAL | 0 refills | Status: DC | PRN

## 2022-06-06 ENCOUNTER — Ambulatory Visit
Admission: RE | Admit: 2022-06-06 | Discharge: 2022-06-06 | Disposition: A | Discharge status: Home / Self Care | Payer: 59 | Source: Ambulatory Visit | Attending: Physician Assistant | Admitting: Physician Assistant

## 2022-06-06 ENCOUNTER — Ambulatory Visit: Payer: Self-pay | Admitting: Physician Assistant

## 2022-06-06 ENCOUNTER — Encounter: Payer: Self-pay | Admitting: Physician Assistant

## 2022-06-06 ENCOUNTER — Ambulatory Visit
Admission: RE | Admit: 2022-06-06 | Discharge: 2022-06-06 | Disposition: A | Discharge status: Home / Self Care | Payer: 59 | Attending: Physician Assistant | Admitting: Physician Assistant

## 2022-06-06 VITALS — BP 135/82 | HR 85 | Temp 98.2°F | Resp 16

## 2022-06-06 DIAGNOSIS — R053 Chronic cough: Secondary | ICD-10-CM | POA: Diagnosis not present

## 2022-06-06 DIAGNOSIS — R059 Cough, unspecified: Secondary | ICD-10-CM | POA: Diagnosis not present

## 2022-06-06 MED ORDER — HYDROCOD POLI-CHLORPHE POLI ER 10-8 MG/5ML PO SUER: 5.0000 mL | Freq: Every evening | ORAL | 0 refills | Status: DC | PRN

## 2022-06-06 MED ORDER — BENZONATATE 200 MG PO CAPS: 200.0000 mg | ORAL_CAPSULE | Freq: Two times a day (BID) | ORAL | 0 refills | Status: DC | PRN

## 2022-06-06 MED ORDER — IPRATROPIUM-ALBUTEROL 0.5-2.5 (3) MG/3ML IN SOLN: 3.0000 mL | Freq: Four times a day (QID) | RESPIRATORY_TRACT | 0 refills | Status: DC | PRN

## 2022-06-06 MED ORDER — IPRATROPIUM-ALBUTEROL 0.5-2.5 (3) MG/3ML IN SOLN
3.0000 mL | Freq: Once | RESPIRATORY_TRACT | Status: AC
  Administered 2022-06-06: 3 mL via RESPIRATORY_TRACT

## 2022-06-06 MED ORDER — FEXOFENADINE-PSEUDOEPHED ER 60-120 MG PO TB12: 1.0000 | ORAL_TABLET | Freq: Two times a day (BID) | ORAL | 0 refills | Status: DC

## 2022-06-06 NOTE — Progress Notes (Signed)
Here 05/05/22 for SOB  Cough went away for about 5 days & started back around 05/18/22 after wife had a cold. Past 2 nights he's slept in the living room. 1/2 time it's dry & 1/2 time is productive (clear - yellow) Having a lot of sinus drainage When touch upper chest & up neck, feels tickly & feels like phlegm blocking  Started using a nebulizer yesterday & it helps about 50%  The Rx cough med he had is gone No OTC cough med helps  AMD

## 2022-06-06 NOTE — Progress Notes (Signed)
   Subjective: Chronic cough    Patient ID: Maxim Bedel, male    DOB: Aug 30, 1980, 42 y.o.   MRN: [id number]  HPI Patient returns for reevaluation of chronic cough which started on May 05, 2022.  Patient states mild transient relief with Phenergan DM and using albuterol inhaler.  Patient states cough is intermittently productive and nonproductive.  States associated sinus congestion with postnasal drainage.  Denies fever chills associated complaint.  Patient states cannot sleep secondary to cough.  Patient states sleeps in a separate room so as not to disturb his wife of continued coughing.  Patient used a family members nebulizer of albuterol moderate relief.   Review of Systems Negative except for above complaint.    Objective:   Physical Exam BP is 135/82, pulse 85, respirations 16, temperature 98.2, patient 90% O2 sat on room air.  Patient was sent for chest x-ray which is grossly unremarkable but pending radiology report.  Patient had a spirometer session today and was able to achieve 111% of predicted values.       Assessment & Plan: Chronic cough  Patient given a prescription for DuoNeb solution, Allegra-D, Tessalon Perles, Tussionex advised to take as directed.  Patient follow-up telephonically in 3 days.

## 2022-06-07 ENCOUNTER — Other Ambulatory Visit: Payer: Self-pay | Admitting: Physician Assistant

## 2022-06-07 MED ORDER — HYDROCODONE BIT-HOMATROP MBR 5-1.5 MG/5ML PO SOLN: 5.0000 mL | Freq: Three times a day (TID) | ORAL | 0 refills | Status: DC | PRN

## 2022-06-16 ENCOUNTER — Ambulatory Visit: Payer: Self-pay

## 2022-06-16 DIAGNOSIS — Z0000 Encounter for general adult medical examination without abnormal findings: Secondary | ICD-10-CM

## 2022-06-16 LAB — POCT URINALYSIS DIPSTICK
Bilirubin, UA: NEGATIVE
Blood, UA: NEGATIVE
Glucose, UA: NEGATIVE
Ketones, UA: NEGATIVE
Leukocytes, UA: NEGATIVE
Nitrite, UA: NEGATIVE
Protein, UA: NEGATIVE
Spec Grav, UA: 1.01 (ref 1.010–1.025)
Urobilinogen, UA: 0.2 E.U./dL
pH, UA: 7 (ref 5.0–8.0)

## 2022-06-17 LAB — CMP12+LP+TP+TSH+6AC+PSA+CBC…
ALT: 15 IU/L (ref 0–44)
AST: 24 IU/L (ref 0–40)
Albumin/Globulin Ratio: 1.6 (ref 1.2–2.2)
Albumin: 4.6 g/dL (ref 4.1–5.1)
Alkaline Phosphatase: 84 IU/L (ref 44–121)
BUN/Creatinine Ratio: 8 — ABNORMAL LOW (ref 9–20)
BUN: 10 mg/dL (ref 6–24)
Basophils Absolute: 0 10*3/uL (ref 0.0–0.2)
Basos: 1 %
Bilirubin Total: 0.5 mg/dL (ref 0.0–1.2)
Calcium: 10 mg/dL (ref 8.7–10.2)
Chloride: 101 mmol/L (ref 96–106)
Chol/HDL Ratio: 3.1 ratio (ref 0.0–5.0)
Cholesterol, Total: 216 mg/dL — ABNORMAL HIGH (ref 100–199)
Creatinine, Ser: 1.18 mg/dL (ref 0.76–1.27)
EOS (ABSOLUTE): 0.1 10*3/uL (ref 0.0–0.4)
Eos: 1 %
Estimated CHD Risk: <0.5 times avg. (ref 0.0–1.0)
Free Thyroxine Index: 1.7 (ref 1.2–4.9)
GGT: 16 IU/L (ref 0–65)
Globulin, Total: 2.9 g/dL (ref 1.5–4.5)
Glucose: 101 mg/dL — ABNORMAL HIGH (ref 70–99)
HDL: 70 mg/dL (ref >39)
Hematocrit: 45.7 % (ref 37.5–51.0)
Hemoglobin: 15.4 g/dL (ref 13.0–17.7)
Immature Grans (Abs): 0 10*3/uL (ref 0.0–0.1)
Immature Granulocytes: 1 %
Iron: 80 ug/dL (ref 38–169)
LDH: 175 IU/L (ref 121–224)
LDL Chol Calc (NIH): 128 mg/dL — ABNORMAL HIGH (ref 0–99)
Lymphocytes Absolute: 1.7 10*3/uL (ref 0.7–3.1)
Lymphs: 31 %
MCH: 28.7 pg (ref 26.6–33.0)
MCHC: 33.7 g/dL (ref 31.5–35.7)
MCV: 85 fL (ref 79–97)
Monocytes Absolute: 0.4 10*3/uL (ref 0.1–0.9)
Monocytes: 7 %
Neutrophils Absolute: 3.3 10*3/uL (ref 1.4–7.0)
Neutrophils: 59 %
Phosphorus: 2.8 mg/dL (ref 2.8–4.1)
Platelets: 273 10*3/uL (ref 150–450)
Potassium: 5.1 mmol/L (ref 3.5–5.2)
Prostate Specific Ag, Serum: 1.5 ng/mL (ref 0.0–4.0)
RBC: 5.37 x10E6/uL (ref 4.14–5.80)
RDW: 12.9 % (ref 11.6–15.4)
Sodium: 141 mmol/L (ref 134–144)
T3 Uptake Ratio: 28 % (ref 24–39)
T4, Total: 6.2 ug/dL (ref 4.5–12.0)
TSH: 1.81 u[IU]/mL (ref 0.450–4.500)
Total Protein: 7.5 g/dL (ref 6.0–8.5)
Triglycerides: 100 mg/dL (ref 0–149)
Uric Acid: 6 mg/dL (ref 3.8–8.4)
VLDL Cholesterol Cal: 18 mg/dL (ref 5–40)
WBC: 5.6 10*3/uL (ref 3.4–10.8)
eGFR: 79 mL/min/{1.73_m2} (ref >59)

## 2022-06-22 ENCOUNTER — Ambulatory Visit: Payer: Self-pay | Admitting: Physician Assistant

## 2022-06-22 ENCOUNTER — Encounter: Payer: Self-pay | Admitting: Physician Assistant

## 2022-06-22 VITALS — BP 119/73 | HR 73 | Temp 97.8°F | Resp 14 | Ht 68.0 in | Wt 185.0 lb

## 2022-06-22 DIAGNOSIS — Z0000 Encounter for general adult medical examination without abnormal findings: Secondary | ICD-10-CM

## 2022-06-22 NOTE — Progress Notes (Signed)
[facility]  ____________________________________________   None    (approximate)  I have reviewed the triage vital signs and the nursing notes.   HISTORY  Chief Complaint No chief complaint on file.   HPI Gary Ortiz is a 42 y.o. male          Past Medical History:  Diagnosis Date   Insomnia     Patient Active Problem List   Diagnosis Date Noted   Insomnia 08/21/2019   Arm pain 01/28/2019   Elbow pain, left 01/08/2019   Traumatic rupture of biceps tendon 11/24/2016    Past Surgical History:  Procedure Laterality Date   ELBOW SURGERY     LASIK     VASECTOMY      Prior to Admission medications   Medication Sig Start Date End Date Taking? Authorizing Provider  albuterol (VENTOLIN HFA) 108 (90 Base) MCG/ACT inhaler Inhale 2 puffs into the lungs every 6 (six) hours as needed for wheezing or shortness of breath. 05/05/22   Sable Feil, PA-C  benzonatate (TESSALON) 200 MG capsule Take 1 capsule (200 mg total) by mouth 2 (two) times daily as needed for cough. 06/06/22   Sable Feil, PA-C  fexofenadine-pseudoephedrine (ALLEGRA-D) 60-120 MG 12 hr tablet Take 1 tablet by mouth 2 (two) times daily. 06/06/22   Sable Feil, PA-C  HYDROcodone bit-homatropine (HYCODAN) 5-1.5 MG/5ML syrup Take 5 mLs by mouth every 8 (eight) hours as needed for cough. 06/07/22   Sable Feil, PA-C  ipratropium-albuterol (DUONEB) 0.5-2.5 (3) MG/3ML SOLN Take 3 mLs by nebulization every 6 (six) hours as needed. 06/06/22   Sable Feil, PA-C  traZODone (DESYREL) 100 MG tablet TAKE ONE TABLET AT BEDTIME AS NEEDED FOR INSOMNIA 05/05/22   Sable Feil, PA-C    Allergies Patient has no known allergies.  No family history on file.  Social History Social History   Tobacco Use   Smoking status: Never   Smokeless tobacco: Never  Vaping Use   Vaping Use: Never used  Substance Use Topics   Alcohol use: Yes    Review of  Systems Constitutional: No fever/chills Eyes: No visual changes. ENT: No sore throat. Cardiovascular: Denies chest pain. Respiratory: Denies shortness of breath.  Asthma Gastrointestinal: No abdominal pain.  No nausea, no vomiting.  No diarrhea.  No constipation. Genitourinary: Negative for dysuria. Musculoskeletal: Negative for back pain. Skin: Negative for rash. Neurological: Negative for headaches, focal weakness or numbness. Psychiatric: Insomnia   ____________________________________________   PHYSICAL EXAM:  VITAL SIGNS: BP is 119/73, pulse 73, respiration 14, temperature 97.8, patient 97% O2 sat on room air.  Patient weighs 185 pounds BMI is 28.13. Constitutional: Alert and oriented. Well appearing and in no acute distress. Eyes: Conjunctivae are normal. PERRL. EOMI. Head: Atraumatic. Nose: No congestion/rhinnorhea. Mouth/Throat: Mucous membranes are moist.  Oropharynx non-erythematous. Neck: No stridor.  No cervical spine tenderness to palpation. Hematological/Lymphatic/Immunilogical: No cervical lymphadenopathy. Cardiovascular: Normal rate, regular rhythm. Grossly normal heart sounds.  Good peripheral circulation. Respiratory: Normal respiratory effort.  No retractions. Lungs CTAB. Gastrointestinal: Soft and nontender. No distention. No abdominal bruits. No CVA tenderness. Genitourinary: Deferred Musculoskeletal: No lower extremity tenderness nor edema.  No joint effusions. Neurologic:  Normal speech and language. No gross focal neurologic deficits are appreciated. No gait instability. Skin:  Skin is warm, dry and intact. No rash noted. Psychiatric: Mood and affect are normal. Speech and behavior are normal.  ____________________________________________   Reva Bores  Component Ref Range & Units 6 d ago (06/16/22) 1 yr ago (04/14/21) 1 yr ago (11/11/20) 2 yr ago (06/18/20) 3 yr ago (05/29/19)  Color, UA  Pale Yellow Pale Yellow Light Yellow yellow yellow   Clarity, UA  Clear Clear Clear clear clear  Glucose, UA Negative Negative Negative Negative Negative Negative  Bilirubin, UA  Negative Negative Negative negative negative  Ketones, UA  Negative Negative Negative negative negative  Spec Grav, UA 1.010 - 1.025 1.010 <=1.005 Abnormal  1.010 1.010 VC 1.010  Blood, UA  Negative Negative Negative negative negative  pH, UA 5.0 - 8.0 7.0 6.5 6.0 6.5 VC 6.0  Protein, UA Negative Negative Negative Negative Negative Negative  Urobilinogen, UA 0.2 or 1.0 E.U./dL 0.2 0.2 0.2 0.2 0.2  Nitrite, UA  Negative Negative Negative negative negative  Leukocytes, UA Negative Negative Negative Negative Negative Negative  Appearance     light VC   Odor                           Component Ref Range & Units 6 d ago (06/16/22) 1 yr ago (04/14/21) 2 yr ago (06/16/20) 3 yr ago (05/29/19) 8 yr ago (03/01/14) 8 yr ago (03/01/14)  Glucose 70 - 99 mg/dL 101 High  91 104 High  R 92 R 89 R   Uric Acid 3.8 - 8.4 mg/dL 6.0 5.9 CM 6.0 CM 5.5 CM    Comment:            Therapeutic target for gout patients: <6.0  BUN 6 - 24 mg/dL 10 16 12 13 $ R 16 R   Creatinine, Ser 0.76 - 1.27 mg/dL 1.18 1.16 1.27 1.12 1.87 High  R   eGFR >59 mL/min/1.73 79 81      BUN/Creatinine Ratio 9 - 20 8 Low  14 9 12    $ Sodium 134 - 144 mmol/L 141 138 140 138 143 R   Potassium 3.5 - 5.2 mmol/L 5.1 4.6 4.9 4.7 3.8 R   Chloride 96 - 106 mmol/L 101 97 101 99 108 High  R   Calcium 8.7 - 10.2 mg/dL 10.0 9.8 9.6 9.3 8.3 Low  R   Phosphorus 2.8 - 4.1 mg/dL 2.8 3.3 3.2 3.3    Total Protein 6.0 - 8.5 g/dL 7.5 7.4 7.6 7.0 6.6 R   Albumin 4.1 - 5.1 g/dL 4.6 4.7 R 4.8 R 4.5 R 3.6 R   Globulin, Total 1.5 - 4.5 g/dL 2.9 2.7 2.8 2.5    Albumin/Globulin Ratio 1.2 - 2.2 1.6 1.7 1.7 1.8    Bilirubin Total 0.0 - 1.2 mg/dL 0.5 0.6 0.6 0.5 0.3 R   Alkaline Phosphatase 44 - 121 IU/L 84 79 77 86 R 74 R, CM   LDH 121 - 224 IU/L 175 162 165 184    AST 0 - 40 IU/L 24 24 25 21 20 $ R   ALT 0 - 44 IU/L 15 13 13 11  17 $ R, CM   GGT 0 - 65 IU/L 16 16 12 11    $ Iron 38 - 169 ug/dL 80 135 106 126    Cholesterol, Total 100 - 199 mg/dL 216 High  210 High  208 High  192    Triglycerides 0 - 149 mg/dL 100 96 63 129    HDL >39 mg/dL 70 86 81 59    VLDL Cholesterol Cal 5 - 40 mg/dL 18 17 11 23    $ LDL Chol Calc (NIH)  0 - 99 mg/dL 128 High  107 High  116 High  110 High     Chol/HDL Ratio 0.0 - 5.0 ratio 3.1 2.4 CM 2.6 CM 3.3 CM    Comment:                                   T. Chol/HDL Ratio                                             Men  Women                               1/2 Avg.Risk  3.4    3.3                                   Avg.Risk  5.0    4.4                                2X Avg.Risk  9.6    7.1                                3X Avg.Risk 23.4   11.0  Estimated CHD Risk 0.0 - 1.0 times avg.  < 0.5  < 0.5 CM  < 0.5 CM  < 0.5 CM    Comment: The CHD Risk is based on the T. Chol/HDL ratio. Other factors affect CHD Risk such as hypertension, smoking, diabetes, severe obesity, and family history of premature CHD.  TSH 0.450 - 4.500 uIU/mL 1.810 1.870 2.680 2.410    T4, Total 4.5 - 12.0 ug/dL 6.2 5.4 5.5 4.9    T3 Uptake Ratio 24 - 39 % 28 28 29 27    $ Free Thyroxine Index 1.2 - 4.9 1.7 1.5 1.6 1.3    Prostate Specific Ag, Serum 0.0 - 4.0 ng/mL 1.5 1.5 CM 0.6 CM 0.5 CM    Comment: Roche ECLIA methodology. According to the American Urological Association, Serum PSA should decrease and remain at undetectable levels after radical prostatectomy. The AUA defines biochemical recurrence as an initial PSA value 0.2 ng/mL or greater followed by a subsequent confirmatory PSA value 0.2 ng/mL or greater. Values obtained with different assay methods or kits cannot be used interchangeably. Results cannot be interpreted as absolute evidence of the presence or absence of malignant disease.  WBC 3.4 - 10.8 x10E3/uL 5.6 5.8 5.0 6.8  7.0 R  RBC 4.14 - 5.80 x10E6/uL 5.37 5.28 5.59 5.59  4.98 R  Hemoglobin 13.0 - 17.7 g/dL  15.4 15.5 16.2 16.4    Hematocrit 37.5 - 51.0 % 45.7 45.7 49.0 46.8    MCV 79 - 97 fL 85 87 88 84  85 R  MCH 26.6 - 33.0 pg 28.7 29.4 29.0 29.3  28.8 R  MCHC 31.5 - 35.7 g/dL 33.7 33.9 33.1 35.0  34.0 R  RDW 11.6 - 15.4 % 12.9 13.3 12.7 12.7  12.8 R  Platelets 150 - 450 x10E3/uL 273 246 243 230  245 R  Neutrophils Not Estab. % 59 56 49  59    Lymphs Not Estab. % 31 33 40 30    Monocytes Not Estab. % 7 7 7 7    $ Eos Not Estab. % 1 3 2 3    $ Basos Not Estab. % 1 1 1 1    $ Neutrophils Absolute 1.4 - 7.0 x10E3/uL 3.3 3.2 2.5 4.1    Lymphocytes Absolute 0.7 - 3.1 x10E3/uL 1.7 1.9 2.0 2.0    Monocytes Absolute 0.1 - 0.9 x10E3/uL 0.4 0.4 0.4 0.5    EOS (ABSOLUTE) 0.0 - 0.4 x10E3/uL 0.1 0.2 0.1 0.2    Basophils Absolute 0.0 - 0.2 x10E3/uL 0.0 0.1 0.0 0.0    Immature Granulocytes Not Estab. % 1 0 1 0                 ____________________________________________  EKG  Sinus rhythm at 73 bpm ____________________________________________    ____________________________________________   INITIAL IMPRESSION / ASSESSMENT AND PLAN   As part of my medical decision making, I reviewed the following data within the electronic MEDICAL RECORD NUMBER       No acute findings on physical exam, EKG, or labs.      ____________________________________________   FINAL CLINICAL IMPRESSION Well exam   ED Discharge Orders     None        Note:  This document was prepared using Dragon voice recognition software and may include unintentional dictation errors.

## 2022-06-22 NOTE — Progress Notes (Signed)
Pt presents today to complete physical. Pt denies any issues or concerns at this time/CL,RMA

## 2022-12-13 ENCOUNTER — Ambulatory Visit: Payer: Self-pay | Admitting: Physician Assistant

## 2022-12-13 ENCOUNTER — Other Ambulatory Visit: Payer: Self-pay | Admitting: Physician Assistant

## 2022-12-13 ENCOUNTER — Encounter: Payer: Self-pay | Admitting: Physician Assistant

## 2022-12-13 ENCOUNTER — Telehealth: Payer: Self-pay

## 2022-12-13 DIAGNOSIS — R059 Cough, unspecified: Secondary | ICD-10-CM

## 2022-12-13 DIAGNOSIS — U071 COVID-19: Secondary | ICD-10-CM

## 2022-12-13 LAB — POC COVID19 BINAXNOW: SARS Coronavirus 2 Ag: POSITIVE — AB

## 2022-12-13 MED ORDER — NIRMATRELVIR/RITONAVIR (PAXLOVID)TABLET: 3.0000 | ORAL_TABLET | Freq: Two times a day (BID) | ORAL | 0 refills | Status: AC

## 2022-12-13 MED ORDER — HYDROCOD POLI-CHLORPHE POLI ER 10-8 MG/5ML PO SUER: 5.0000 mL | Freq: Two times a day (BID) | ORAL | Status: DC | PRN

## 2022-12-13 MED ORDER — HYDROCOD POLI-CHLORPHE POLI ER 10-8 MG/5ML PO SUER: 5.0000 mL | Freq: Two times a day (BID) | ORAL | 0 refills | Status: DC | PRN

## 2022-12-13 MED ORDER — IBUPROFEN 800 MG PO TABS: 800.0000 mg | ORAL_TABLET | Freq: Three times a day (TID) | ORAL | 0 refills | Status: DC | PRN

## 2022-12-13 NOTE — Progress Notes (Signed)
Covid test for c/o three days of cough and sinus pressure/allergy s/s.  Positive covid test in car and results to pt and provider.  Stated hx covid had extreme cough and needed nebs.  Will talk to provider.

## 2022-12-13 NOTE — Addendum Note (Signed)
Addended by: Hansel Feinstein on: 12/13/2022 12:41 PM   Modules accepted: Orders

## 2022-12-13 NOTE — Progress Notes (Signed)
   Subjective: Upper respiratory infection    Patient ID: Delta Ising, male    DOB: 03-15-1981, 42 y.o.   MRN: [id number]  HPI Patient complains of 3 days of continuous cough, sinus congestion, body aches, and malaise. Denies recent travel or known contact with COVID-19.  However patient test positive for COVID-19 this morning in the parking lot of the clinic. Review of Systems     Objective:   Physical Exam Deferred secondary to this been a telephonic encounter.       Assessment & Plan: COVID-19   Patient was scheduled Paxlovid and Tussionex.  Advised to follow CDC recommendation for quarantine.

## 2022-12-14 NOTE — Telephone Encounter (Signed)
Spoke with provider about pharmacy/Tussinex and pt aware.

## 2023-01-18 ENCOUNTER — Other Ambulatory Visit: Payer: Self-pay

## 2023-01-18 DIAGNOSIS — R053 Chronic cough: Secondary | ICD-10-CM

## 2023-01-18 DIAGNOSIS — R0602 Shortness of breath: Secondary | ICD-10-CM

## 2023-01-18 MED ORDER — LEVALBUTEROL TARTRATE 45 MCG/ACT IN AERO: 2.0000 | INHALATION_SPRAY | RESPIRATORY_TRACT | 12 refills | Status: DC | PRN

## 2023-01-23 ENCOUNTER — Encounter: Payer: Self-pay | Admitting: Physician Assistant

## 2023-01-23 ENCOUNTER — Ambulatory Visit: Payer: Self-pay | Admitting: Physician Assistant

## 2023-01-23 VITALS — BP 115/73 | HR 78 | Temp 97.5°F | Resp 16

## 2023-01-23 DIAGNOSIS — S9031XA Contusion of right foot, initial encounter: Secondary | ICD-10-CM

## 2023-01-23 DIAGNOSIS — R0981 Nasal congestion: Secondary | ICD-10-CM

## 2023-01-23 MED ORDER — BENZONATATE 200 MG PO CAPS: 200.0000 mg | ORAL_CAPSULE | Freq: Two times a day (BID) | ORAL | 0 refills | Status: DC | PRN

## 2023-01-23 MED ORDER — HYDROCOD POLI-CHLORPHE POLI ER 10-8 MG/5ML PO SUER: 5.0000 mL | Freq: Every evening | ORAL | 0 refills | Status: DC | PRN

## 2023-01-23 NOTE — Progress Notes (Signed)
Deep cough - happens everytime after I get Covid.  Using OTC Mucous DM medication - OTC meds doesn't help & Tessalon Pearls don't help.  States only thing that helps is the Tussionex with codeine.  Wooden stairs (approx 200 lbs) slipped out of his hands & landed on his right foot. Has been icing it.  States it has a knot in it.  Once a day he's been using the Rx Ibuprofen previously prescribed to him.  No wrap or support used.  AMD

## 2023-01-23 NOTE — Progress Notes (Signed)
Subjective: Nasal congestion, cough, and right foot pain    Patient ID: Theory Imig, male    DOB: May 28, 1980, 42 y.o.   MRN: [id number]  HPI Patient was diagnosed with COVID 2 weeks ago.  Patient continues to have nasal congestion and cough secondary to postnasal drainage.  Patient denies fever/ chills associated complaint.  Patient also complain of bruise to the dorsal aspect of the right foot secondary to blunt trauma this past weekend.  States wooden stairs fell on his foot.  Patient states able to ambulate but noticed difficulty going up stairs and prolonged standing.   Review of Systems Insomnia    Objective:   Physical Exam BP 115/73  BP Location Left Arm  Patient Position Sitting  Cuff Size Large  Pulse 78  Resp 16  Temp 97.5 F (36.4 C)  Temp src Temporal  SpO2 95 %  HEENT remarkable for edematous nasal turbinates with clear rhinorrhea.  Patient has copious postnasal drainage.  Nonproductive cough. Neck is supple without lymphadenopathy or bruits. Lungs are clear to auscultation however patient has increased coughing with inspiration. Heart is regular rate and rhythm. Examination right foot shows hematoma and ecchymosis dorsal aspect of right foot.  Moderate guarding with palpation.       Assessment & Plan: Sinus congestion and right foot contusion  Continue over-the-counter decongestant.  Take Tessalon twice daily and use Tussionex at night.  Patient placed on sedentary duty pending results of right foot x-ray.

## 2023-02-06 ENCOUNTER — Ambulatory Visit: Payer: Self-pay | Admitting: Physician Assistant

## 2023-02-06 ENCOUNTER — Other Ambulatory Visit: Payer: Self-pay | Admitting: Physician Assistant

## 2023-02-06 ENCOUNTER — Encounter: Payer: Self-pay | Admitting: Physician Assistant

## 2023-02-06 DIAGNOSIS — R197 Diarrhea, unspecified: Secondary | ICD-10-CM

## 2023-02-06 MED ORDER — DIPHENOXYLATE-ATROPINE 2.5-0.025 MG PO TABS: 1.0000 | ORAL_TABLET | Freq: Four times a day (QID) | ORAL | 0 refills | Status: DC | PRN

## 2023-02-06 NOTE — Addendum Note (Signed)
Addended by: Gardner Candle on: 02/06/2023 03:45 PM   Modules accepted: Orders
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
## 2023-02-09 LAB — OVA AND PARASITE EXAMINATION

## 2023-03-09 ENCOUNTER — Other Ambulatory Visit: Payer: Self-pay | Admitting: Physician Assistant

## 2023-03-09 DIAGNOSIS — G4700 Insomnia, unspecified: Secondary | ICD-10-CM

## 2023-03-14 ENCOUNTER — Other Ambulatory Visit: Payer: Self-pay

## 2023-03-14 DIAGNOSIS — G4700 Insomnia, unspecified: Secondary | ICD-10-CM

## 2023-03-15 MED ORDER — TRAZODONE HCL 100 MG PO TABS: ORAL_TABLET | ORAL | 2 refills | Status: DC

## 2023-03-27 ENCOUNTER — Encounter: Payer: Self-pay | Admitting: Physician Assistant

## 2023-03-27 ENCOUNTER — Ambulatory Visit: Payer: Self-pay | Admitting: Physician Assistant

## 2023-03-27 VITALS — BP 122/82 | Temp 98.2°F | Resp 16 | Ht 68.0 in | Wt 190.0 lb

## 2023-03-27 DIAGNOSIS — M25562 Pain in left knee: Secondary | ICD-10-CM

## 2023-03-27 MED ORDER — MELOXICAM 15 MG PO TABS: 15.0000 mg | ORAL_TABLET | Freq: Every day | ORAL | 0 refills | Status: DC

## 2023-03-27 NOTE — Progress Notes (Signed)
Workers comp injury left knee reporting on lateral side after having a pain walking on different levels on DOI 03/23/23.  Stated pain level at this time at rest is level 5 in left knee.  Stated known arthritis to that knee he was informed before, but no surgeries to that area.  Stated with activity it's up to level 10.  Stated some edema noted.  Currently using NSAIDs with minimal relief.

## 2023-03-27 NOTE — Progress Notes (Signed)
   Subjective: Left knee pain    Patient ID: Gary Ortiz, male    DOB: 09/24/1980, 42 y.o.   MRN: [id number]  HPI Patient complaining of 4 days of left knee pain secondary to overuse at work.  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  Patient states there was moderate edema to the lateral aspect of the inferior patella with palpable pain.  Mild relief over-the-counter anti-inflammatory medications.  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  No documentation to support this in his medical records.   Review of Systems Negative septa above complaint    Objective:   Physical Exam BP 122/82  Resp 16  Temp 98.2 F (36.8 C)  SpO2 97 %  Weight 190 lb (86.2 kg)  Height 5\' 8"  (1.727 m)   BMI 28.89 kg/m2  BSA 2.03 m2  Ambulates with atypical gait.  No obvious deformity to the left knee.  No ecchymosis or erythema.  Mild edema distal aspect of the patella.  Mild crepitus with palpation.  Full and equal range of motion.  Strength against resistance 5/5.       Assessment & Plan:  Differential is consistent with sprain versus avulsion fracture.  Patient placed in elastic knee support and sent for imaging.  Patient given a prescription for Mobic and will follow-up in 1 week.  Sooner if condition  complaint worsens.

## 2023-04-17 ENCOUNTER — Other Ambulatory Visit: Payer: 59 | Admitting: Physician Assistant

## 2023-05-31 ENCOUNTER — Encounter: Payer: Self-pay | Admitting: Physician Assistant

## 2023-05-31 ENCOUNTER — Ambulatory Visit: Payer: Self-pay

## 2023-05-31 VITALS — BP 127/76 | HR 88 | Resp 16 | Ht 68.0 in | Wt 200.0 lb

## 2023-05-31 DIAGNOSIS — Z1152 Encounter for screening for COVID-19: Secondary | ICD-10-CM

## 2023-05-31 DIAGNOSIS — J324 Chronic pansinusitis: Secondary | ICD-10-CM

## 2023-05-31 LAB — POCT INFLUENZA A/B
Influenza A, POC: NEGATIVE
Influenza B, POC: NEGATIVE

## 2023-05-31 LAB — POC COVID19 BINAXNOW: SARS Coronavirus 2 Ag: NEGATIVE

## 2023-05-31 MED ORDER — AMOXICILLIN-POT CLAVULANATE 875-125 MG PO TABS: 1.0000 | ORAL_TABLET | Freq: Two times a day (BID) | ORAL | 0 refills | Status: AC

## 2023-05-31 MED ORDER — PREDNISONE 10 MG PO TABS: 10.0000 mg | ORAL_TABLET | Freq: Every day | ORAL | 0 refills | Status: AC

## 2023-05-31 NOTE — Progress Notes (Signed)
Pt presents today with sinus issues for a week today. Past couple days its moved into chest with cough. Pt complains of dry sinus blowing out blood.

## 2023-05-31 NOTE — Progress Notes (Signed)
Acute Office Visit  Subjective:     Patient ID: Gary Ortiz, male    DOB: [birth year], 43 y.o.   MRN: [id number]  Chief Complaint  Patient presents with   Annual Exam    HPI Patient is in today for evaluation of sinus complaints.  Complains of sinus pain and drainage over the last 10 days.  States that it has began to move into his chest over the last 3 days.  Complains of bloody drainage at the nose.  He does have minimal productive cough with yellowish discharge which does produce.  Denies any fever or chills.  History of sinus infections.  Denies any GI complaints.       Objective:    BP 127/76   Pulse 88   Resp 16   Ht 5\' 8"  (1.727 m)   Wt 200 lb (90.7 kg)   SpO2 97%   BMI 30.41 kg/m    Physical Exam Constitutional:      Appearance: Normal appearance.  HENT:     Head: Normocephalic.     Right Ear: Tympanic membrane, ear canal and external ear normal. There is no impacted cerumen.     Left Ear: Tympanic membrane, ear canal and external ear normal. There is no impacted cerumen.     Nose: Rhinorrhea present.     Mouth/Throat:     Mouth: Mucous membranes are moist.     Pharynx: Oropharyngeal exudate present.  Eyes:     Conjunctiva/sclera: Conjunctivae normal.  Pulmonary:     Effort: Pulmonary effort is normal.     Breath sounds: Normal breath sounds.  Musculoskeletal:     Cervical back: Neck supple.  Lymphadenopathy:     Cervical: Cervical adenopathy present.  Neurological:     Mental Status: He is alert.     Results for orders placed or performed in visit on 05/31/23  POC COVID-19 BinaxNow  Result Value Ref Range   SARS Coronavirus 2 Ag Negative Negative  POCT Influenza A/B  Result Value Ref Range   Influenza A, POC Negative Negative   Influenza B, POC Negative Negative        Assessment & Plan:  Impression: Sinusitis Plan: Augmentin 875 twice daily for 10 days, prednisone 10 Mg for 3 days, Mucinex, Sudafed, NyQuil Discussed physical  exam findings with patient.  Patient presents with approximately 10 days of worsening sinus complaints.  Complains of bloody drainage through the nasal cavity with a minimally productive cough over the last couple of days with discoloration.  No fever or chills.  No GI complaints.  He does have a history of sinusitis.  Given progressive complaints, discussed starting an antibiotic and patient was agreeable.  Augmentin 875 twice daily for 10 days was provided.  Also discussed option prednisone he was agreeable.  Advised Mucinex, Sudafed.  Recommend follow-up in 7 days for reevaluation if symptoms have not improved.  Advised to present to clinic sooner if symptoms worsen.  Patient expressed understanding and agreement of plan. Problem List Items Addressed This Visit   None Visit Diagnoses       Encounter for screening for COVID-19    -  Primary   Relevant Orders   POC COVID-19 BinaxNow (Completed)   POCT Influenza A/B (Completed)     Chronic pansinusitis       Relevant Medications   amoxicillin-clavulanate (AUGMENTIN) 875-125 MG tablet   predniSONE (DELTASONE) 10 MG tablet       Meds ordered this encounter  Medications  amoxicillin-clavulanate (AUGMENTIN) 875-125 MG tablet    Sig: Take 1 tablet by mouth 2 (two) times daily for 10 days.    Dispense:  20 tablet    Refill:  0    Supervising Provider:   Erasmo Downer [[id number]]   predniSONE (DELTASONE) 10 MG tablet    Sig: Take 1 tablet (10 mg total) by mouth daily with breakfast for 3 days.    Dispense:  3 tablet    Refill:  0    Supervising Provider:   Erasmo Downer [[id number]]    No follow-ups on file.  Ivar Drape, PA-C

## 2023-06-07 ENCOUNTER — Other Ambulatory Visit: Payer: Self-pay | Admitting: Physician Assistant

## 2023-06-07 DIAGNOSIS — G4700 Insomnia, unspecified: Secondary | ICD-10-CM

## 2023-06-08 ENCOUNTER — Other Ambulatory Visit: Payer: 59

## 2023-06-08 DIAGNOSIS — Z20828 Contact with and (suspected) exposure to other viral communicable diseases: Secondary | ICD-10-CM

## 2023-06-08 LAB — POC COVID19 BINAXNOW: SARS Coronavirus 2 Ag: NEGATIVE

## 2023-06-08 LAB — POCT INFLUENZA A/B
Influenza A, POC: NEGATIVE
Influenza B, POC: NEGATIVE

## 2023-06-08 NOTE — Progress Notes (Signed)
 Tested negative for flu/covid after request due to family at home with flu and s/s starting URI.  Will notify us  if further assist needed.

## 2023-06-12 ENCOUNTER — Other Ambulatory Visit: Payer: Self-pay | Admitting: Physician Assistant

## 2023-06-12 ENCOUNTER — Ambulatory Visit: Payer: Self-pay | Admitting: Physician Assistant

## 2023-06-12 DIAGNOSIS — R059 Cough, unspecified: Secondary | ICD-10-CM

## 2023-06-12 MED ORDER — HYDROCOD POLI-CHLORPHE POLI ER 10-8 MG/5ML PO SUER: 5.0000 mL | Freq: Two times a day (BID) | ORAL | 0 refills | Status: DC

## 2023-06-12 MED ORDER — BENZONATATE 200 MG PO CAPS: 200.0000 mg | ORAL_CAPSULE | Freq: Two times a day (BID) | ORAL | 0 refills | Status: DC | PRN

## 2023-06-12 NOTE — Progress Notes (Signed)
   Subjective:Non Productive cough    Patient ID: Gary Ortiz, male    DOB: 1980/06/18, 43 y.o.   MRN: [id number]  HPI Patient complain of 5 days of nonproductive cough.  States intermittent fever.  States coughing spells cause dizziness.  Patient stated mild relief with nebulizers.  Patient is tested and found negative for COVID-19 3 days ago.   Review of Systems Negative septa above complaint    Objective:   Physical Exam  Physical exam was deferred secondary to a telephonic encounter.      Assessment & Plan: Nonproductive cough

## 2023-08-15 ENCOUNTER — Ambulatory Visit: Payer: Self-pay

## 2023-08-15 DIAGNOSIS — Z0000 Encounter for general adult medical examination without abnormal findings: Secondary | ICD-10-CM

## 2023-08-15 LAB — POCT URINALYSIS DIPSTICK
Bilirubin, UA: NEGATIVE
Glucose, UA: NEGATIVE
Ketones, UA: NEGATIVE
Leukocytes, UA: NEGATIVE
Nitrite, UA: NEGATIVE
Protein, UA: NEGATIVE
Spec Grav, UA: <=1.005 — AB (ref 1.010–1.025)
Urobilinogen, UA: 0.2 U/dL
pH, UA: 7.5 (ref 5.0–8.0)

## 2023-08-15 NOTE — Progress Notes (Signed)
 Fasting labs, urine and EKG obtained pre physical.  Reports getting sick repeatedly and sinus issues and request vitamin levels checked.

## 2023-08-16 LAB — CMP12+LP+TP+TSH+6AC+PSA+CBC…
ALT: 14 IU/L (ref 0–44)
AST: 20 IU/L (ref 0–40)
Albumin: 4.1 g/dL (ref 4.1–5.1)
Alkaline Phosphatase: 81 IU/L (ref 44–121)
BUN/Creatinine Ratio: 9 (ref 9–20)
BUN: 11 mg/dL (ref 6–24)
Basophils Absolute: 0 10*3/uL (ref 0.0–0.2)
Basos: 0 %
Bilirubin Total: 0.6 mg/dL (ref 0.0–1.2)
Calcium: 9.3 mg/dL (ref 8.7–10.2)
Chloride: 96 mmol/L (ref 96–106)
Chol/HDL Ratio: 3 ratio (ref 0.0–5.0)
Cholesterol, Total: 177 mg/dL (ref 100–199)
Creatinine, Ser: 1.27 mg/dL (ref 0.76–1.27)
EOS (ABSOLUTE): 0.1 10*3/uL (ref 0.0–0.4)
Eos: 1 %
Estimated CHD Risk: <0.5 times avg. (ref 0.0–1.0)
Free Thyroxine Index: 1.7 (ref 1.2–4.9)
GGT: 19 IU/L (ref 0–65)
Globulin, Total: 3.2 g/dL (ref 1.5–4.5)
Glucose: 92 mg/dL (ref 70–99)
HDL: 60 mg/dL (ref >39)
Hematocrit: 44 % (ref 37.5–51.0)
Hemoglobin: 14.9 g/dL (ref 13.0–17.7)
Immature Grans (Abs): 0 10*3/uL (ref 0.0–0.1)
Immature Granulocytes: 0 %
Iron: 37 ug/dL — ABNORMAL LOW (ref 38–169)
LDH: 176 IU/L (ref 121–224)
LDL Chol Calc (NIH): 103 mg/dL — ABNORMAL HIGH (ref 0–99)
Lymphocytes Absolute: 1.7 10*3/uL (ref 0.7–3.1)
Lymphs: 15 %
MCH: 29.3 pg (ref 26.6–33.0)
MCHC: 33.9 g/dL (ref 31.5–35.7)
MCV: 87 fL (ref 79–97)
Monocytes Absolute: 0.9 10*3/uL (ref 0.1–0.9)
Monocytes: 8 %
Neutrophils Absolute: 8.2 10*3/uL — ABNORMAL HIGH (ref 1.4–7.0)
Neutrophils: 76 %
Phosphorus: 2.7 mg/dL — ABNORMAL LOW (ref 2.8–4.1)
Platelets: 266 10*3/uL (ref 150–450)
Potassium: 4.5 mmol/L (ref 3.5–5.2)
Prostate Specific Ag, Serum: 1.2 ng/mL (ref 0.0–4.0)
RBC: 5.08 x10E6/uL (ref 4.14–5.80)
RDW: 13.1 % (ref 11.6–15.4)
Sodium: 137 mmol/L (ref 134–144)
T3 Uptake Ratio: 28 % (ref 24–39)
T4, Total: 6.1 ug/dL (ref 4.5–12.0)
TSH: 2.01 u[IU]/mL (ref 0.450–4.500)
Total Protein: 7.3 g/dL (ref 6.0–8.5)
Triglycerides: 77 mg/dL (ref 0–149)
Uric Acid: 5.6 mg/dL (ref 3.8–8.4)
VLDL Cholesterol Cal: 14 mg/dL (ref 5–40)
WBC: 10.9 10*3/uL — ABNORMAL HIGH (ref 3.4–10.8)
eGFR: 72 mL/min/{1.73_m2} (ref >59)

## 2023-08-16 LAB — VITAMIN D 25 HYDROXY (VIT D DEFICIENCY, FRACTURES): Vit D, 25-Hydroxy: 50.6 ng/mL (ref 30.0–100.0)

## 2023-08-16 LAB — VITAMIN B12: Vitamin B-12: 754 pg/mL (ref 232–1245)

## 2023-08-16 LAB — HGB A1C W/O EAG: Hgb A1c MFr Bld: 5.5 % (ref 4.8–5.6)

## 2023-08-28 ENCOUNTER — Encounter: Payer: Self-pay | Admitting: Physician Assistant

## 2023-08-28 ENCOUNTER — Ambulatory Visit: Payer: Self-pay | Admitting: Physician Assistant

## 2023-08-28 VITALS — BP 115/85 | HR 84 | Temp 97.8°F | Resp 12 | Ht 68.0 in | Wt 201.0 lb

## 2023-08-28 DIAGNOSIS — J9801 Acute bronchospasm: Secondary | ICD-10-CM

## 2023-08-28 DIAGNOSIS — R053 Chronic cough: Secondary | ICD-10-CM

## 2023-08-28 DIAGNOSIS — Z0000 Encounter for general adult medical examination without abnormal findings: Secondary | ICD-10-CM

## 2023-08-28 MED ORDER — HYDROCOD POLI-CHLORPHE POLI ER 10-8 MG/5ML PO SUER: 5.0000 mL | Freq: Two times a day (BID) | ORAL | 0 refills | Status: DC

## 2023-08-28 NOTE — Progress Notes (Signed)
 [facility] ____________________________________________   None    (approximate)  I have reviewed the triage vital signs and the nursing notes.   HISTORY  Chief Complaint Annual Exam   HPI Gary Ortiz is a 43 y.o. male patient presents for annual physical exam.  Patient complain of chronic cough which occurs every 2 to 3 weeks and lasts approximately 2 to 3 weeks.  Onset of complaint status post diagnosed with COVID 2 years ago.  States coughing spell sometimes causes transient vision disturbance and he feels like having low blood pressure.  Patient denies tobacco use.  No acute findings on checks x-rays of the past 2 years.         Past Medical History:  Diagnosis Date   Insomnia     Patient Active Problem List   Diagnosis Date Noted   Insomnia 08/21/2019   Arm pain 01/28/2019   Elbow pain, left 01/08/2019   Traumatic rupture of biceps tendon 11/24/2016    Past Surgical History:  Procedure Laterality Date   ELBOW SURGERY     LASIK     VASECTOMY      Prior to Admission medications   Medication Sig Start Date End Date Taking? Authorizing Provider  benzonatate  (TESSALON ) 200 MG capsule Take 1 capsule (200 mg total) by mouth 2 (two) times daily as needed for cough. 06/12/23  Yes Marcina Severe, PA-C  fexofenadine  (ALLEGRA) 180 MG tablet Take 180 mg by mouth daily.   Yes [provider]  fluticasone (FLONASE) 50 MCG/ACT nasal spray Place 1 spray into both nostrils 2 (two) times daily as needed for allergies or rhinitis.   Yes [provider]  levalbuterol  (XOPENEX  HFA) 45 MCG/ACT inhaler Inhale 2 puffs into the lungs every 4 (four) hours as needed for wheezing (as needed for wheezing or shortness ob breath). 01/18/23  Yes Marcina Severe, PA-C  traZODone  (DESYREL ) 100 MG tablet TAKE 1 TABLET BY MOUTH AT BEDTIME AS NEEDED FOR INSOMNIA. 06/07/23  Yes Marcina Severe, PA-C  chlorpheniramine-HYDROcodone  (TUSSIONEX)  10-8 MG/5ML Take 5 mLs by mouth 2 (two) times daily. Patient not taking: Reported on 08/28/2023 06/12/23   Marcina Severe, PA-C  diphenoxylate -atropine  (LOMOTIL ) 2.5-0.025 MG tablet Take 1 tablet by mouth 4 (four) times daily as needed for diarrhea or loose stools. Patient not taking: Reported on 03/27/2023 02/06/23   Marcina Severe, PA-C  meloxicam  (MOBIC ) 15 MG tablet Take 1 tablet (15 mg total) by mouth daily. Patient not taking: Reported on 08/28/2023 03/27/23   Marcina Severe, PA-C    Allergies Patient has no known allergies.  No family history on file.  Social History Social History   Tobacco Use   Smoking status: Never   Smokeless tobacco: Never  Vaping Use   Vaping status: Never Used  Substance Use Topics   Alcohol use: Yes    Review of Systems  Constitutional: No fever/chills Eyes: No visual changes. ENT: No sore throat. Cardiovascular: Denies chest pain. Respiratory: Denies shortness of breath.  Chronic cough Gastrointestinal: No abdominal pain.  No nausea, no vomiting.  No diarrhea.  No constipation. Genitourinary: Negative for dysuria. Musculoskeletal: Negative for back pain. Skin: Negative for rash. Neurological: Negative for headaches, focal weakness or numbness. Psychiatric: Insomnia  ____________________________________________   PHYSICAL EXAM:  VITAL SIGNS: BP 115/85  BP Location Left Arm  Patient Position Sitting  Cuff Size Large  Pulse Rate 84  Temp 97.8 F (36.6 C)  Temp Source Temporal  Weight 201 lb (91.2 kg)  Height 5\' 8"  (1.727 m)  Resp 12  SpO2 99 %   Other Vitals   BMI: 30.56 kg/m2  BSA: 2.09 m2   Constitutional: Alert and oriented. Well appearing and in no acute distress. Eyes: Conjunctivae are normal. PERRL. EOMI. Head: Atraumatic. Nose: No congestion/rhinnorhea. Mouth/Throat: Mucous membranes are moist.  Oropharynx non-erythematous. Neck: No stridor.  o cervical spine tenderness to  palpation. Hematological/Lymphatic/Immunilogical: No cervical lymphadenopathy. Cardiovascular: Normal rate, regular rhythm. Grossly normal heart sounds.  Good peripheral circulation. Respiratory: Normal respiratory effort.  No retractions. Lungs CTAB.  Nonproductive cough Gastrointestinal: Soft and nontender. No distention. No abdominal bruits. No CVA tenderness. Genitourinary: Deferred Musculoskeletal: No lower extremity tenderness nor edema.  No joint effusions. Neurologic:  Normal speech and language. No gross focal neurologic deficits are appreciated. No gait instability. Skin:  Skin is warm, dry and intact. No rash noted. Psychiatric: Mood and affect are normal. Speech and behavior are normal.  ____________________________________________   LABS          Component Ref Range & Units (hover) 13 d ago (08/15/23) 1 yr ago (06/16/22) 2 yr ago (04/14/21) 2 yr ago (11/11/20) 3 yr ago (06/18/20) 4 yr ago (05/29/19)  Color, UA yellow Pale Yellow Pale Yellow Light Yellow yellow yellow  Clarity, UA clear Clear Clear Clear clear clear  Glucose, UA Negative Negative Negative Negative Negative Negative  Bilirubin, UA neg Negative Negative Negative negative negative  Ketones, UA neg Negative Negative Negative negative negative  Spec Grav, UA <=1.005 Abnormal  1.010 <=1.005 Abnormal  1.010 1.010 VC 1.010  Blood, UA 2+ Negative Negative Negative negative negative  pH, UA 7.5 7.0 6.5 6.0 6.5 VC 6.0  Protein, UA Negative Negative Negative Negative Negative Negative  Urobilinogen, UA 0.2 0.2 0.2 0.2 0.2 0.2  Nitrite, UA neg Negative Negative Negative negative negative  Leukocytes, UA Negative Negative Negative Negative Negative Negative  Appearance     light VC   Odor                   View All Conversations on this Encounter                Component Ref Range & Units (hover) 13 d ago (08/15/23) 1 yr ago (06/16/22) 2 yr ago (04/14/21) 3 yr ago (06/16/20) 4 yr ago (05/29/19) 9 yr  ago (03/01/14) 9 yr ago (03/01/14)  Glucose 92 101 High  91 104 High  R 92 R 89 R   Uric Acid 5.6 6.0 CM 5.9 CM 6.0 CM 5.5 CM    Comment:            Therapeutic target for gout patients: <6.0  BUN 11 10 16 12 13  R 16 R   Creatinine, Ser 1.27 1.18 1.16 1.27 1.12 1.87 High  R   eGFR 72 79 81      BUN/Creatinine Ratio 9 8 Low  14 9 12     Sodium 137 141 138 140 138 143 R   Potassium 4.5 5.1 4.6 4.9 4.7 3.8 R   Chloride 96 101 97 101 99 108 High  R   Calcium 9.3 10.0 9.8 9.6 9.3 8.3 Low  R   Phosphorus 2.7 Low  2.8 3.3 3.2 3.3    Total Protein 7.3 7.5 7.4 7.6 7.0 6.6 R   Albumin 4.1 4.6 4.7 R 4.8 R 4.5 R 3.6 R   Globulin, Total 3.2 2.9 2.7 2.8 2.5    Bilirubin Total 0.6  0.5 0.6 0.6 0.5 0.3 R   Alkaline Phosphatase 81 84 79 77 86 R 74 R, CM   LDH 176 175 162 165 184    AST 20 24 24 25 21 20  R   ALT 14 15 13 13 11 17  R, CM   GGT 19 16 16 12 11     Iron 37 Low  80 135 106 126    Cholesterol, Total 177 216 High  210 High  208 High  192    Triglycerides 77 100 96 63 129    HDL 60 70 86 81 59    VLDL Cholesterol Cal 14 18 17 11 23     LDL Chol Calc (NIH) 103 High  128 High  107 High  116 High  110 High     Chol/HDL Ratio 3.0 3.1 CM 2.4 CM 2.6 CM 3.3 CM    Comment:                                   T. Chol/HDL Ratio                                             Men  Women                               1/2 Avg.Risk  3.4    3.3                                   Avg.Risk  5.0    4.4                                2X Avg.Risk  9.6    7.1                                3X Avg.Risk 23.4   11.0  Estimated CHD Risk  < 0.5  < 0.5 CM  < 0.5 CM  < 0.5 CM  < 0.5 CM    Comment: The CHD Risk is based on the T. Chol/HDL ratio. Other factors affect CHD Risk such as hypertension, smoking, diabetes, severe obesity, and family history of premature CHD.  TSH 2.010 1.810 1.870 2.680 2.410    T4, Total 6.1 6.2 5.4 5.5 4.9    T3 Uptake Ratio 28 28 28 29 27     Free Thyroxine Index 1.7 1.7 1.5 1.6 1.3     Prostate Specific Ag, Serum 1.2 1.5 CM 1.5 CM 0.6 CM 0.5 CM    Comment: Roche ECLIA methodology. According to the American Urological Association, Serum PSA should decrease and remain at undetectable levels after radical prostatectomy. The AUA defines biochemical recurrence as an initial PSA value 0.2 ng/mL or greater followed by a subsequent confirmatory PSA value 0.2 ng/mL or greater. Values obtained with different assay methods or kits cannot be used interchangeably. Results cannot be interpreted as absolute evidence of the presence or absence of malignant disease.  WBC 10.9 High  5.6 5.8 5.0 6.8  7.0 R  RBC 5.08 5.37 5.28 5.59 5.59  4.98 R  Hemoglobin 14.9 15.4 15.5 16.2 16.4    Hematocrit 44.0 45.7 45.7 49.0 46.8    MCV 87 85 87 88 84  85 R  MCH 29.3 28.7 29.4 29.0 29.3  28.8 R  MCHC 33.9 33.7 33.9 33.1 35.0  34.0 R  RDW 13.1 12.9 13.3 12.7 12.7  12.8 R  Platelets 266 273 246 243 230  245 R  Neutrophils 76 59 56 49 59    Lymphs 15 31 33 40 30    Monocytes 8 7 7 7 7     Eos 1 1 3 2 3     Basos 0 1 1 1 1     Neutrophils Absolute 8.2 High  3.3 3.2 2.5 4.1    Lymphocytes Absolute 1.7 1.7 1.9 2.0 2.0    Monocytes Absolute 0.9 0.4 0.4 0.4 0.5    EOS (ABSOLUTE) 0.1 0.1 0.2 0.1 0.2    Basophils Absolute 0.0 0.0 0.1 0.0 0.0    Immature Granulocytes 0 1 0 1 0    Immature Grans (Abs) 0.0 0.0 0.0 0.1 0.0    Resulting Agency [facility]               Component Ref Range & Units (hover) 13 d ago  Hgb A1c MFr Bld 5.5  Comment:          Prediabetes: 5.7 - 6.4          Diabetes: >6.4          Glycemic control for adults with diabetes: <7.0        ____________________________________________  EKG  Sinus rhythm at 85 bpm ____________________________________________    ____________________________________________   INITIAL IMPRESSION / ASSESSMENT AND PLAN  As part of my medical decision making, I  reviewed the following data within the electronic MEDICAL RECORD NUMBER Notes from prior ED visits and [place] Controlled Substance Database     No acute findings on physical exam except for nonproductive cough.  No acute findings on EKG or labs.  Cough is probably due to bronchospasm        ____________________________________________   FINAL CLINICAL IMPRESSION  Well exam except for nonproductive cough.  Will consult to pulmonology for definitive evaluation.  Patient advised to continue taking Tessalon  [place] on the day and may use Tussionex at night.   ED Discharge Orders     None        Note:  This document was prepared using Dragon voice recognition software and may include unintentional dictation errors.

## 2023-08-28 NOTE — Progress Notes (Signed)
 Keeps getting a cough that's lasing for 3-4 weeks & then he's OK for 4-6 weeks and then it comes back. Starts with  low grade fever and body aches & then progresses to a cough. States cough is productive - mucus is thick & when he lays down at night it makes him feel like he's going to suffocate.  States this has been going on for over 3 years - started after the 1st time he had Covid.

## 2023-08-28 NOTE — Addendum Note (Signed)
 Addended by: Walt Gunner on: 08/28/2023 01:45 PM   Modules accepted: Orders

## 2023-09-11 DIAGNOSIS — J4541 Moderate persistent asthma with (acute) exacerbation: Secondary | ICD-10-CM | POA: Diagnosis not present

## 2023-09-19 ENCOUNTER — Other Ambulatory Visit: Payer: Self-pay

## 2023-09-19 DIAGNOSIS — J020 Streptococcal pharyngitis: Secondary | ICD-10-CM

## 2023-09-19 LAB — POCT RAPID STREP A (OFFICE): Rapid Strep A Screen: NEGATIVE

## 2023-09-19 NOTE — Progress Notes (Signed)
 Wife positive for strept and c/o sore throat and test obtained.

## 2023-09-20 ENCOUNTER — Other Ambulatory Visit: Payer: Self-pay

## 2023-09-20 ENCOUNTER — Encounter: Admitting: Physician Assistant

## 2023-09-20 DIAGNOSIS — Z0283 Encounter for blood-alcohol and blood-drug test: Secondary | ICD-10-CM

## 2023-09-27 ENCOUNTER — Encounter: Payer: Self-pay | Admitting: Physician Assistant

## 2023-09-27 DIAGNOSIS — R053 Chronic cough: Secondary | ICD-10-CM | POA: Insufficient documentation

## 2023-09-27 DIAGNOSIS — D802 Selective deficiency of immunoglobulin A [IgA]: Secondary | ICD-10-CM | POA: Diagnosis not present

## 2023-09-27 DIAGNOSIS — J4541 Moderate persistent asthma with (acute) exacerbation: Secondary | ICD-10-CM | POA: Diagnosis not present

## 2023-10-03 ENCOUNTER — Ambulatory Visit: Admitting: Pulmonary Disease

## 2023-10-25 ENCOUNTER — Ambulatory Visit: Payer: Self-pay | Admitting: Physician Assistant

## 2023-10-25 ENCOUNTER — Encounter: Payer: Self-pay | Admitting: Physician Assistant

## 2023-10-25 VITALS — BP 112/81 | HR 73 | Temp 98.0°F | Resp 16 | Wt 196.0 lb

## 2023-10-25 DIAGNOSIS — J45901 Unspecified asthma with (acute) exacerbation: Secondary | ICD-10-CM | POA: Insufficient documentation

## 2023-10-25 DIAGNOSIS — M791 Myalgia, unspecified site: Secondary | ICD-10-CM

## 2023-10-25 NOTE — Progress Notes (Signed)
 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  Pain at rest is about level 3 and with a pushing motion reports sharp pain such as lifting to drink he has to stop motion.  Reports no injuries reported to right arm.  Points to right shoulder anterior side discomfort stated the feeling and right elbow pain.  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

## 2023-10-25 NOTE — Progress Notes (Signed)
   Subjective: Right upper extremity pain    Patient ID: Gary Ortiz, male    DOB: [birth year], 43 y.o.   MRN: [id number]  HPI Patient complaining of pain to the right GH joint and medial aspect of the right elbow.  Instant.  Send occurred during working straining yesterday.  Patient was firing a rifle and noticed pain in the [facility] joint.  Patient later at home he was bringing a cup/glass to his mouth.  He noticed discomfort to the medial aspect of the right elbow.  Patient has history of right elbow pain, was diagnosed tendinitis requiring surgery left bicep tendon rupture.  Patient is right-hand dominant. Review of Systems    Objective:   Physical Exam BP112/81Pulse Rate73Temp98 F (36.7 C)Weight196 lb (88.9 kg)Resp16SpO296 % No acute distress.  Patient is right-hand dominant.  No obvious deformity.  Full and equal range of motion of the shoulder and elbow.  Moderate guarding palpation at the [facility] joint of the right upper extremity.  Mild guarding with palpation of the medial tendon of the right elbow       Assessment & Plan: Right shoulder and elbow pain.  Patient sent to [facility] for definitive evaluation and treatment.  Patient put on work restrictions until cleared by orthopedics.

## 2023-11-06 ENCOUNTER — Other Ambulatory Visit: Payer: Self-pay | Admitting: Physician Assistant

## 2023-11-23 ENCOUNTER — Ambulatory Visit: Payer: Self-pay | Admitting: Physician Assistant

## 2023-11-23 ENCOUNTER — Encounter: Payer: Self-pay | Admitting: Physician Assistant

## 2023-11-23 DIAGNOSIS — S46011D Strain of muscle(s) and tendon(s) of the rotator cuff of right shoulder, subsequent encounter: Secondary | ICD-10-CM

## 2023-11-23 NOTE — Progress Notes (Signed)
   Subjective: Return to work evaluation    Patient ID: Gary Ortiz, male    DOB: June 30, 1980, 43 y.o.   MRN: [id number]  HPI Patient follow-up for right rotator cuff strain secondary to lifting straining.  Patient placed on modified duty and sent to [facility] for evaluation.  No acute findings on exam.  Patient Worker's Comp. claim was denied.  Patient continues to complain of mild discomfort but was returned back to full duties.  Review of Systems Negative septa for above complaint    Objective:   Physical Exam  No acute distress.  No obvious deformity to the right shoulder.  Patient has full and equal range of motion.  Patient is neurovascular intact.  Strength against resistance is 4/5 compared to the left upper extremity.      Assessment & Plan: Right rotator cuff strain   Patient returned back to a trial of full duties.  Follow-up if condition worsens.

## 2023-11-23 NOTE — Progress Notes (Signed)
 Stated his comp claim was denied as idiopathic and was eval at [facility].  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

## 2023-12-12 ENCOUNTER — Other Ambulatory Visit: Payer: Self-pay | Admitting: Physician Assistant

## 2023-12-12 DIAGNOSIS — G4700 Insomnia, unspecified: Secondary | ICD-10-CM

## 2024-01-02 ENCOUNTER — Encounter: Payer: Self-pay | Admitting: Physician Assistant

## 2024-01-02 ENCOUNTER — Ambulatory Visit: Payer: Self-pay | Admitting: Physician Assistant

## 2024-01-02 DIAGNOSIS — J020 Streptococcal pharyngitis: Secondary | ICD-10-CM

## 2024-01-02 DIAGNOSIS — R0989 Other specified symptoms and signs involving the circulatory and respiratory systems: Secondary | ICD-10-CM

## 2024-01-02 LAB — POC COVID19 BINAXNOW: SARS Coronavirus 2 Ag: NEGATIVE

## 2024-01-02 LAB — POCT RAPID STREP A (OFFICE): Rapid Strep A Screen: POSITIVE — AB

## 2024-01-02 MED ORDER — PSEUDOEPH-BROMPHEN-DM 30-2-10 MG/5ML PO SYRP: 5.0000 mL | ORAL_SOLUTION | Freq: Four times a day (QID) | ORAL | 0 refills | Status: AC | PRN

## 2024-01-02 MED ORDER — AZITHROMYCIN 250 MG PO TABS: ORAL_TABLET | ORAL | 0 refills | Status: AC

## 2024-01-02 NOTE — Progress Notes (Signed)
   Subjective: Sore throat and chest congestion    Patient ID: Gary Ortiz, male    DOB: January 10, 1981, 43 y.o.   MRN: [id number]  HPI Patient presents for sore throat and chest congestion for 4 days.  Denies recent travel or known contact with COVID-19.  Denies fever/chills associated complaint.  Cough is productive.   Review of Systems Asthma and insomnia    Objective:   Physical Exam Patient tested positive for strep pharyngitis. Deferred secondary to telephonic encounter.       Assessment & Plan: Strep pharyngitis   Patient given a prescription for Z-Pak and Bromfed-DM.  Follow-up as needed.

## 2024-01-25 DIAGNOSIS — H52223 Regular astigmatism, bilateral: Secondary | ICD-10-CM | POA: Diagnosis not present

## 2024-02-21 ENCOUNTER — Other Ambulatory Visit: Payer: Self-pay

## 2024-02-21 NOTE — Progress Notes (Signed)
 Random UDS and BAT completed after consents per [facility] protocol.  Cleared BAT and UDS ready for pick up for [facility].

## 2024-03-12 ENCOUNTER — Other Ambulatory Visit: Payer: Self-pay | Admitting: Physician Assistant

## 2024-03-12 DIAGNOSIS — R0602 Shortness of breath: Secondary | ICD-10-CM

## 2024-03-13 ENCOUNTER — Other Ambulatory Visit: Payer: Self-pay

## 2024-03-13 DIAGNOSIS — R0602 Shortness of breath: Secondary | ICD-10-CM

## 2024-03-19 DIAGNOSIS — J4540 Moderate persistent asthma, uncomplicated: Secondary | ICD-10-CM | POA: Diagnosis not present

## 2024-03-19 NOTE — Progress Notes (Signed)
 "                                DIVISION OF PULMONARY AND CRITICAL CARE MEDICINE                              FOLLOW UP ENCOUNTER     Chief complaint: Mild to moderate persistent Asthma   History of Present Illness Gary Ortiz is a 43 year old male with asthma who presents with sinus drainage and a bloody nose.  He has been experiencing sinus drainage and epistaxis for about a week. This is a recurring issue, as he typically experiences two sinus infections per year, with one occurring around November. He uses Flonase daily, which he believes helps with his allergy symptoms.  He has a history of asthma and uses Trelegy daily, which he finds beneficial. He also uses a regular albuterol  inhaler, which he recently refilled. He experiences shortness of breath on exertion, though it is not as severe as in the past. He has previously been prescribed prednisone , which caused significant side effects including agitation, anxiety, and decreased libido, leading him to avoid it in the future. He also experienced issues with Singulair, which caused agitation and sleeplessness, prompting him to stop taking it.  He mentions using codeine cough syrup in the past, which he found effective and plans to use only at home to avoid interference with work. He is concerned about his autoimmune status, particularly in relation to potential blood transfusions, and wants this information documented in his medical file.  He is physically active. He is mindful of his health, considering dietary changes and vitamin supplementation to support his immune system. No recent COVID-19 infection.   Past Medical History:  No past medical history on file.  Past Surgical History:  No past surgical history on file.  Allergies:  No Known Allergies  Current Medications:   Prior to Admission medications  Medication Sig Taking? Last Dose  fluticasone-umeclidinium-vilanterol (TRELEGY ELLIPTA) 200-62.5-25 mcg inhaler Inhale  1 Puff into the lungs once daily Yes Taking  HYDROcodone -chlorpheniramine (TUSSIONEX) 10-8 mg/5 mL ER suspension Take 5 mLs by mouth at bedtime as needed for Cough Yes   traZODone  (DESYREL ) 100 MG tablet at bedtime Yes Taking  azithromycin  (ZITHROMAX ) 250 MG tablet Take 2 tablets (500mg ) by mouth on Day 1. Take 1 tablet (250mg ) by mouth on Days 2-5.    benzonatate  (TESSALON ) 200 MG capsule Take 200 mg by mouth as needed Patient not taking: Reported on 03/19/2024  Not Taking  colchicine (COLCRYS) 0.6 mg tablet Take 1 tablet (0.6 mg total) by mouth once daily      Family History:  No family history on file.  Social History:   Social History   Socioeconomic History   Marital status: Single  Tobacco Use   Smoking status: Some Days    Types: Cigars   Smokeless tobacco: Never   Tobacco comments:    Patient confirms smoking 1-3 cigars per month. 09/11/2023 -IC   Social Drivers of Health   Financial Resource Strain: Low Risk  (09/11/2023)   Overall Financial Resource Strain (CARDIA)    Difficulty of Paying Living Expenses: Not hard at all  Food Insecurity: No Food Insecurity (09/11/2023)   Hunger Vital Sign    Worried About Running Out of Food in the Last Year: Never true    Ran Out of Food  in the Last Year: Never true  Transportation Needs: No Transportation Needs (09/11/2023)   PRAPARE - Administrator, Civil Service (Medical): No    Lack of Transportation (Non-Medical): No  Housing Stability: Low Risk  (09/11/2023)   Housing Stability Vital Sign    Unable to Pay for Housing in the Last Year: No    Number of Times Moved in the Last Year: 0    Homeless in the Last Year: No    Review of Systems:   A 10 point review of systems is negative, except for the pertinent positives and negatives detailed in the HPI.  Vitals:   Vitals:   03/19/24 1348  BP: 112/74  BP Location: Right upper arm  Patient Position: Sitting  BP Cuff Size: Large Adult  Pulse: 104   SpO2: 98%  Weight: 87.1 kg (192 lb)  Height: 172.7 cm (5' 8)     Body mass index is 29.19 kg/m.  Physical Exam:   Physical Exam Vitals and nursing note reviewed.  Constitutional:      General: in no acute distress.    Appearance: Normal appearance. Is not ill-appearing, toxic-appearing or diaphoretic.  HENT:     Head: Normocephalic and atraumatic.     Right Ear: External ear normal.     Left Ear: External ear normal.  Eyes:     General:        Right eye: No discharge.        Left eye: No discharge.     Extraocular Movements: Extraocular movements intact.     Pupils: Pupils are equal, round, and reactive to light.  Cardiovascular:     Rate and Rhythm: Normal rate and regular rhythm.     Pulses: Normal pulses.     Heart sounds: Normal heart sounds. No murmur heard.    No friction rub. No gallop.  Abdominal:     General: Bowel sounds are normal.  Skin:    General: Skin is warm and dry.     Capillary Refill: Capillary refill takes less than 2 seconds.  Neurological:     Mental Status: Patient is alert.     Lab and Imaging Results:   Results      Assessment and Plan:   There are no diagnoses linked to this encounter.  Assessment & Plan Moderate persistent asthma -ACT is 16 uncontrolled.  Unable to tolerate steroids due to severe adverse effects Moderate persistent asthma with acute exacerbation, presenting with increased cough and shortness of breath on exertion. Previous exacerbations managed with prednisone , which caused significant side effects including agitation, anxiety, and decreased libido. Current exacerbation likely due to asthma rather than autoimmune issues. Discussed nonsteroidal options for asthma management, including Dupixent, an injectable medication that could provide more stable symptom control without the need for antibiotics. Dupixent is covered by the manufacturer for all patients, making it accessible despite high cost. - Prescribed  nonsteroidal anti-inflammatory medication for severe inflammation, one pill daily for seven days. - Continue Trelegy Ellipta inhaler once daily. - Use regular albuterol  inhaler as needed. - Will consider Dupixent for long-term asthma management if symptoms persist.  Acute sinusitis and allergic rhinitis Acute sinusitis with sinus drainage and epistaxis, likely exacerbated by seasonal allergies. Flonase use may have contributed to epistaxis. Previous sinus infections occur biannually, often around November. Decision made to avoid prednisone  due to adverse effects experienced previously. Codeine cough syrup was well-tolerated in the past and will be used as needed. - Prescribed Z-Pak (azithromycin ) for  sinusitis. - Discontinued Flonase and use saline nasal spray for nasal irrigation. - Prescribed codeine cough syrup for symptomatic relief, to be used at home.    I spent 41 minutes in both face-to-face and non-face-to-face activities including reviewing history, performing an exam and evaluation, entering clinical information EHR, interpreting results, counseling patient/family/caregiver, reviewing x-rays/CT scans/MRI/labs/echocardiography/PFTs, ordering meds/tests/procedures, referring and communicating with consulting healthcare professionals and care coordination. This does not include time spent with staff.    The patient and/or family voices understanding of the plans. All questions and concerns were answered. The patient and /or family was instructed to call if the patient needs to be seen sooner. Thank you for allowing me to participate in the care of this patient. Do not hesitate to contact me via Epic or by calling our office at [phone] with any questions or concerns.     This note has been created using dictation software tool and any typographical errors are purely unintentional.  Patient received an After Visit Summary     "

## 2024-04-04 DIAGNOSIS — J4540 Moderate persistent asthma, uncomplicated: Secondary | ICD-10-CM | POA: Diagnosis not present

## 2024-04-22 ENCOUNTER — Ambulatory Visit: Payer: Self-pay | Admitting: Physician Assistant

## 2024-04-22 DIAGNOSIS — J988 Other specified respiratory disorders: Secondary | ICD-10-CM | POA: Diagnosis not present

## 2024-04-22 DIAGNOSIS — R059 Cough, unspecified: Secondary | ICD-10-CM | POA: Diagnosis not present

## 2024-04-22 DIAGNOSIS — B9789 Other viral agents as the cause of diseases classified elsewhere: Secondary | ICD-10-CM | POA: Diagnosis not present

## 2024-04-22 DIAGNOSIS — J4540 Moderate persistent asthma, uncomplicated: Secondary | ICD-10-CM | POA: Diagnosis not present

## 2024-06-06 ENCOUNTER — Other Ambulatory Visit: Payer: Self-pay | Admitting: Physician Assistant

## 2024-06-06 DIAGNOSIS — G4700 Insomnia, unspecified: Secondary | ICD-10-CM
